# Patient Record
Sex: Male | Born: 1975 | Race: White | Hispanic: No | Smoking: Current every day smoker
Health system: Southern US, Community
[De-identification: ages and names within clinical notes are randomized; demographics above are authoritative.]

## PROBLEM LIST (undated history)

## (undated) DIAGNOSIS — Z789 Other specified health status: Secondary | ICD-10-CM

## (undated) HISTORY — PX: COLON SURGERY: SHX602

---

## 1998-08-03 ENCOUNTER — Emergency Department (HOSPITAL_COMMUNITY): Admission: EM | Admit: 1998-08-03 | Discharge: 1998-08-03 | Payer: Self-pay | Admitting: Emergency Medicine

## 1998-08-16 ENCOUNTER — Emergency Department (HOSPITAL_COMMUNITY): Admission: EM | Admit: 1998-08-16 | Discharge: 1998-08-16 | Payer: Self-pay | Admitting: Emergency Medicine

## 1998-12-06 ENCOUNTER — Encounter: Admission: RE | Admit: 1998-12-06 | Discharge: 1998-12-06 | Payer: Self-pay | Admitting: Family Medicine

## 1999-01-24 ENCOUNTER — Encounter: Admission: RE | Admit: 1999-01-24 | Discharge: 1999-01-24 | Payer: Self-pay | Admitting: Family Medicine

## 2003-06-05 ENCOUNTER — Emergency Department (HOSPITAL_COMMUNITY): Admission: AD | Admit: 2003-06-05 | Discharge: 2003-06-05 | Payer: Self-pay | Admitting: Emergency Medicine

## 2003-06-05 ENCOUNTER — Encounter: Payer: Self-pay | Admitting: Emergency Medicine

## 2003-07-02 ENCOUNTER — Emergency Department (HOSPITAL_COMMUNITY): Admission: EM | Admit: 2003-07-02 | Discharge: 2003-07-03 | Payer: Self-pay | Admitting: Emergency Medicine

## 2003-07-02 ENCOUNTER — Encounter: Payer: Self-pay | Admitting: Emergency Medicine

## 2003-07-11 ENCOUNTER — Ambulatory Visit (HOSPITAL_BASED_OUTPATIENT_CLINIC_OR_DEPARTMENT_OTHER): Admission: RE | Admit: 2003-07-11 | Discharge: 2003-07-11 | Payer: Self-pay | Admitting: Orthopedic Surgery

## 2009-04-02 ENCOUNTER — Emergency Department (HOSPITAL_COMMUNITY): Admission: EM | Admit: 2009-04-02 | Discharge: 2009-04-02 | Payer: Self-pay | Admitting: Family Medicine

## 2011-04-25 NOTE — Op Note (Signed)
NAMEWESTLEY, BLASS                        ACCOUNT NO.:  [id number]   MEDICAL RECORD NO.:  [id number]                   PATIENT TYPE:  AMB   LOCATION:  DSC                                  FACILITY:  [facility]   PHYSICIAN:  Rodney A. Chaney Malling, M.D.           DATE OF BIRTH:  1976/11/17   DATE OF PROCEDURE:  07/11/2003  DATE OF DISCHARGE:                                 OPERATIVE REPORT   PREOPERATIVE DIAGNOSIS:  Fracture of mid-third fifth metacarpal, left hand.   POSTOPERATIVE DIAGNOSIS:  Fracture of mid-third fifth metacarpal, left hand.   OPERATION:  Open reduction and internal fixation using five-hole mini-plate  and fixation screws.   SURGEON:  Lenard Galloway. Chaney Malling, M.D.   ANESTHESIA:  General.   PROCEDURE:  The patient was placed on the operating table in the supine  position with a pneumatic tourniquet about the left upper arm.  The left  upper extremity was prepped with DuraPrep and draped out in the usual  manner.  The hand was then wrapped out with an Esmarch. Tourniquet was  elevated.  An incision was made over the dorsal aspect of the fifth  metacarpal.  The skin edges were retracted.  The loupe magnification was  used throughout.  Superficial cutaneous nerves seen, identified, isolated,  and retracted out of the wound.  Extensor tendons were also moved out of the  wound.  Dissection was carried down to the fifth metacarpal.  The fracture  site could clearly be seen.  A small periosteal elevator was used to strip  off the soft tissue, and with traction, an excellent anatomic reduction was  achieved.  A five-hole plate was selected with the middle hole over the  fracture site.  Drill holes were placed through the other holes, and  appropriate length screws were used to stabilize and affix the plate to the  fracture.  Once this was completed, there was excellent stability.  There  was a good range of motion.  No rotatory malalignment of the finger  __________ palm of  the hand.  No instability.  The wound was irrigated with  saline solution.  X-rays were taken to confirm position of the plate and  length of the screws, and this all appeared quite nice.  Skin edges were  then closed with running 4-0 nylon suture, and Marcaine was used in the  wound for anesthesia.  A large bulky compressive dressing was then applied,  and the patient was returned to the recovery room in excellent condition.  Technically, this went extremely well.   DRAINS:  None.   COMPLICATIONS:  None.                                               Rodney A. Chaney Malling, M.D.    RAM/MEDQ  D:  07/11/2003  T:  07/11/2003  Job:  [id number]

## 2012-11-28 ENCOUNTER — Emergency Department: Payer: Self-pay | Admitting: Emergency Medicine

## 2013-02-09 ENCOUNTER — Encounter (HOSPITAL_COMMUNITY): Admission: EM | Disposition: A | Discharge status: Home Health | Payer: Self-pay | Source: Home / Self Care

## 2013-02-09 ENCOUNTER — Encounter (HOSPITAL_COMMUNITY): Payer: Self-pay | Admitting: Anesthesiology

## 2013-02-09 ENCOUNTER — Inpatient Hospital Stay (HOSPITAL_COMMUNITY): Payer: Self-pay | Admitting: Certified Registered Nurse Anesthetist

## 2013-02-09 ENCOUNTER — Inpatient Hospital Stay (HOSPITAL_COMMUNITY): Payer: Self-pay

## 2013-02-09 ENCOUNTER — Encounter (HOSPITAL_COMMUNITY): Payer: Self-pay | Admitting: Certified Registered Nurse Anesthetist

## 2013-02-09 ENCOUNTER — Emergency Department (HOSPITAL_COMMUNITY): Payer: Self-pay

## 2013-02-09 ENCOUNTER — Emergency Department (HOSPITAL_COMMUNITY): Payer: Self-pay | Admitting: Anesthesiology

## 2013-02-09 ENCOUNTER — Inpatient Hospital Stay (HOSPITAL_COMMUNITY)
Admission: EM | Admit: 2013-02-09 | Discharge: 2013-02-19 | DRG: 492 | Disposition: A | Discharge status: Home Health | Payer: MEDICAID | Attending: General Surgery | Admitting: General Surgery

## 2013-02-09 ENCOUNTER — Encounter (HOSPITAL_COMMUNITY): Payer: Self-pay

## 2013-02-09 DIAGNOSIS — K560 Paralytic ileus: Secondary | ICD-10-CM | POA: Diagnosis not present

## 2013-02-09 DIAGNOSIS — Y990 Civilian activity done for income or pay: Secondary | ICD-10-CM

## 2013-02-09 DIAGNOSIS — J189 Pneumonia, unspecified organism: Secondary | ICD-10-CM | POA: Diagnosis not present

## 2013-02-09 DIAGNOSIS — Y93H2 Activity, gardening and landscaping: Secondary | ICD-10-CM

## 2013-02-09 DIAGNOSIS — S82209A Unspecified fracture of shaft of unspecified tibia, initial encounter for closed fracture: Secondary | ICD-10-CM

## 2013-02-09 DIAGNOSIS — Z23 Encounter for immunization: Secondary | ICD-10-CM

## 2013-02-09 DIAGNOSIS — R188 Other ascites: Secondary | ICD-10-CM

## 2013-02-09 DIAGNOSIS — S139XXA Sprain of joints and ligaments of unspecified parts of neck, initial encounter: Secondary | ICD-10-CM | POA: Diagnosis present

## 2013-02-09 DIAGNOSIS — S3681XA Injury of peritoneum, initial encounter: Secondary | ICD-10-CM

## 2013-02-09 DIAGNOSIS — IMO0002 Reserved for concepts with insufficient information to code with codable children: Secondary | ICD-10-CM | POA: Diagnosis present

## 2013-02-09 DIAGNOSIS — Y838 Other surgical procedures as the cause of abnormal reaction of the patient, or of later complication, without mention of misadventure at the time of the procedure: Secondary | ICD-10-CM | POA: Diagnosis not present

## 2013-02-09 DIAGNOSIS — S3991XA Unspecified injury of abdomen, initial encounter: Secondary | ICD-10-CM

## 2013-02-09 DIAGNOSIS — K55059 Acute (reversible) ischemia of intestine, part and extent unspecified: Secondary | ICD-10-CM

## 2013-02-09 DIAGNOSIS — K929 Disease of digestive system, unspecified: Secondary | ICD-10-CM | POA: Diagnosis not present

## 2013-02-09 DIAGNOSIS — D62 Acute posthemorrhagic anemia: Secondary | ICD-10-CM | POA: Diagnosis not present

## 2013-02-09 DIAGNOSIS — T1490XA Injury, unspecified, initial encounter: Secondary | ICD-10-CM

## 2013-02-09 DIAGNOSIS — W230XXA Caught, crushed, jammed, or pinched between moving objects, initial encounter: Secondary | ICD-10-CM | POA: Diagnosis present

## 2013-02-09 DIAGNOSIS — E872 Acidosis, unspecified: Secondary | ICD-10-CM

## 2013-02-09 DIAGNOSIS — R578 Other shock: Secondary | ICD-10-CM

## 2013-02-09 DIAGNOSIS — S36899A Unspecified injury of other intra-abdominal organs, initial encounter: Secondary | ICD-10-CM

## 2013-02-09 DIAGNOSIS — S82871A Displaced pilon fracture of right tibia, initial encounter for closed fracture: Secondary | ICD-10-CM

## 2013-02-09 DIAGNOSIS — S82409A Unspecified fracture of shaft of unspecified fibula, initial encounter for closed fracture: Secondary | ICD-10-CM

## 2013-02-09 HISTORY — DX: Other specified health status: Z78.9

## 2013-02-09 HISTORY — PX: EXTERNAL FIXATION LEG: SHX1549

## 2013-02-09 HISTORY — PX: LAPAROTOMY: SHX154

## 2013-02-09 LAB — COMPREHENSIVE METABOLIC PANEL WITH GFR
ALT: 25 U/L (ref 0–53)
Alkaline Phosphatase: 45 U/L (ref 39–117)
BUN: 11 mg/dL (ref 6–23)
CO2: 23 meq/L (ref 19–32)
GFR calc Af Amer: >90 mL/min (ref >90)
GFR calc non Af Amer: 87 mL/min — ABNORMAL LOW (ref >90)
Glucose, Bld: 186 mg/dL — ABNORMAL HIGH (ref 70–99)
Potassium: 3 meq/L — ABNORMAL LOW (ref 3.5–5.1)
Total Protein: 7.7 g/dL (ref 6.0–8.3)

## 2013-02-09 LAB — CG4 I-STAT (LACTIC ACID): Lactic Acid, Venous: 4.36 mmol/L — ABNORMAL HIGH (ref 0.5–2.2)

## 2013-02-09 LAB — POCT I-STAT, CHEM 8
BUN: 11 mg/dL (ref 6–23)
Calcium, Ion: 1.11 mmol/L — ABNORMAL LOW (ref 1.12–1.23)
Chloride: 102 mEq/L (ref 96–112)
Creatinine, Ser: 1 mg/dL (ref 0.50–1.35)
Glucose, Bld: 182 mg/dL — ABNORMAL HIGH (ref 70–99)
HCT: 47 % (ref 39.0–52.0)
Hemoglobin: 16 g/dL (ref 13.0–17.0)
Potassium: 2.9 mEq/L — ABNORMAL LOW (ref 3.5–5.1)
Sodium: 138 mEq/L (ref 135–145)
TCO2: 24 mmol/L (ref 0–100)

## 2013-02-09 LAB — POCT I-STAT 7, (LYTES, BLD GAS, ICA,H+H)
Acid-base deficit: 1 mmol/L (ref 0.0–2.0)
Bicarbonate: 26.2 meq/L — ABNORMAL HIGH (ref 20.0–24.0)
Calcium, Ion: 1.11 mmol/L — ABNORMAL LOW (ref 1.12–1.23)
HCT: 31 % — ABNORMAL LOW (ref 39.0–52.0)
Hemoglobin: 10.5 g/dL — ABNORMAL LOW (ref 13.0–17.0)
O2 Saturation: 100 %
Patient temperature: 36.2
Potassium: 3.6 mEq/L (ref 3.5–5.1)
Sodium: 137 meq/L (ref 135–145)
TCO2: 28 mmol/L (ref 0–100)
pCO2 arterial: 51.1 mmHg — ABNORMAL HIGH (ref 35.0–45.0)
pH, Arterial: 7.313 — ABNORMAL LOW (ref 7.350–7.450)
pO2, Arterial: 477 mmHg — ABNORMAL HIGH (ref 80.0–100.0)

## 2013-02-09 LAB — COMPREHENSIVE METABOLIC PANEL
AST: 30 U/L (ref 0–37)
Albumin: 4.5 g/dL (ref 3.5–5.2)
Calcium: 10.1 mg/dL (ref 8.4–10.5)
Chloride: 96 mEq/L (ref 96–112)
Creatinine, Ser: 1.09 mg/dL (ref 0.50–1.35)
Sodium: 137 mEq/L (ref 135–145)
Total Bilirubin: 0.4 mg/dL (ref 0.3–1.2)

## 2013-02-09 LAB — ABO/RH: ABO/RH(D): A POS

## 2013-02-09 LAB — PROTIME-INR
INR: 1.09 (ref 0.00–1.49)
Prothrombin Time: 14 s (ref 11.6–15.2)

## 2013-02-09 LAB — CBC
HCT: 43 % (ref 39.0–52.0)
Hemoglobin: 15.6 g/dL (ref 13.0–17.0)
MCH: 31.4 pg (ref 26.0–34.0)
MCHC: 36.3 g/dL — ABNORMAL HIGH (ref 30.0–36.0)
MCV: 86.5 fL (ref 78.0–100.0)
Platelets: 301 10*3/uL (ref 150–400)
RBC: 4.97 MIL/uL (ref 4.22–5.81)
RDW: 12.3 % (ref 11.5–15.5)
WBC: 20.6 10*3/uL — ABNORMAL HIGH (ref 4.0–10.5)

## 2013-02-09 SURGERY — EXTERNAL FIXATION, LOWER EXTREMITY
Anesthesia: General | Site: Leg Lower | Laterality: Right | Wound class: Clean

## 2013-02-09 SURGERY — LAPAROTOMY, EXPLORATORY
Anesthesia: General | Site: Abdomen | Wound class: Clean Contaminated

## 2013-02-09 MED ORDER — ONDANSETRON HCL 4 MG/2ML IJ SOLN
INTRAMUSCULAR | Status: DC | PRN
  Administered 2013-02-09: 4 mg via INTRAVENOUS

## 2013-02-09 MED ORDER — FENTANYL CITRATE 0.05 MG/ML IJ SOLN
INTRAMUSCULAR | Status: AC
  Filled 2013-02-09: qty 2

## 2013-02-09 MED ORDER — LACTATED RINGERS IV SOLN
INTRAVENOUS | Status: DC | PRN
  Administered 2013-02-09: 18:00:00 via INTRAVENOUS

## 2013-02-09 MED ORDER — CEFAZOLIN SODIUM-DEXTROSE 2-3 GM-% IV SOLR
2.0000 g | INTRAVENOUS | Status: DC
  Filled 2013-02-09: qty 50

## 2013-02-09 MED ORDER — ROCURONIUM BROMIDE 100 MG/10ML IV SOLN
INTRAVENOUS | Status: DC | PRN
  Administered 2013-02-09: 30 mg via INTRAVENOUS
  Administered 2013-02-09: 10 mg via INTRAVENOUS
  Administered 2013-02-09: 20 mg via INTRAVENOUS

## 2013-02-09 MED ORDER — SUCCINYLCHOLINE CHLORIDE 20 MG/ML IJ SOLN
INTRAMUSCULAR | Status: DC | PRN
  Administered 2013-02-09: 100 mg via INTRAVENOUS

## 2013-02-09 MED ORDER — SODIUM CHLORIDE 0.9 % IV SOLN
INTRAVENOUS | Status: AC | PRN
  Administered 2013-02-09: 75 mL/h via INTRAVENOUS

## 2013-02-09 MED ORDER — ONDANSETRON HCL 4 MG/2ML IJ SOLN
INTRAMUSCULAR | Status: AC
  Administered 2013-02-09: 4 mg
  Filled 2013-02-09: qty 2

## 2013-02-09 MED ORDER — HYDROMORPHONE 0.3 MG/ML IV SOLN
INTRAVENOUS | Status: AC
  Administered 2013-02-09: 23:00:00
  Filled 2013-02-09: qty 25

## 2013-02-09 MED ORDER — HYDROMORPHONE 0.3 MG/ML IV SOLN
INTRAVENOUS | Status: DC
  Administered 2013-02-10: 1.9 mg via INTRAVENOUS
  Administered 2013-02-10: 3.6 mg via INTRAVENOUS
  Administered 2013-02-10: 3.9 mg via INTRAVENOUS
  Administered 2013-02-10: 1.8 mg via INTRAVENOUS
  Administered 2013-02-10: 22:00:00 via INTRAVENOUS
  Administered 2013-02-10: 3.26 mg via INTRAVENOUS
  Administered 2013-02-10: 3.3 mg via INTRAVENOUS
  Administered 2013-02-10: 05:00:00 via INTRAVENOUS
  Administered 2013-02-10: 2.1 mg via INTRAVENOUS
  Administered 2013-02-11: 3.6 mg via INTRAVENOUS
  Administered 2013-02-11: 1.5 mg via INTRAVENOUS
  Administered 2013-02-11: 3.3 mg via INTRAVENOUS
  Administered 2013-02-11: 4.72 mg via INTRAVENOUS
  Administered 2013-02-11: 3 mg via INTRAVENOUS
  Administered 2013-02-11: 06:00:00 via INTRAVENOUS
  Administered 2013-02-12: 3 mg via INTRAVENOUS
  Administered 2013-02-12: 0.3 mg via INTRAVENOUS
  Administered 2013-02-12: 2.4 mg via INTRAVENOUS
  Administered 2013-02-12 (×2): 1.8 mg via INTRAVENOUS
  Administered 2013-02-12: 02:00:00 via INTRAVENOUS
  Administered 2013-02-13: 1.2 mg via INTRAVENOUS
  Administered 2013-02-13: 1.5 mg via INTRAVENOUS
  Administered 2013-02-13: 12:00:00 via INTRAVENOUS
  Administered 2013-02-13: 1.5 mg via INTRAVENOUS
  Administered 2013-02-13 (×2): 0.9 mg via INTRAVENOUS
  Administered 2013-02-14 (×2): via INTRAVENOUS
  Administered 2013-02-14: 1.8 mg via INTRAVENOUS
  Administered 2013-02-14: 0.9 mg via INTRAVENOUS
  Administered 2013-02-14: 2.7 mg via INTRAVENOUS
  Administered 2013-02-15: 2.1 mg via INTRAVENOUS
  Administered 2013-02-15: 2.4 mg via INTRAVENOUS
  Administered 2013-02-15: 3.9 mg via INTRAVENOUS
  Administered 2013-02-15: 2.1 mg via INTRAVENOUS
  Administered 2013-02-15: 13:00:00 via INTRAVENOUS
  Administered 2013-02-15: 3.29 mg via INTRAVENOUS
  Administered 2013-02-15 (×2): 2.1 mg via INTRAVENOUS
  Administered 2013-02-16: 1.2 mg via INTRAVENOUS
  Administered 2013-02-16: 2.37 mg via INTRAVENOUS
  Administered 2013-02-16: 2.1 mg via INTRAVENOUS
  Administered 2013-02-16: 3.3 mg via INTRAVENOUS
  Administered 2013-02-16: 2.6 mg via INTRAVENOUS
  Administered 2013-02-16: 03:00:00 via INTRAVENOUS
  Administered 2013-02-17: 1.2 mg via INTRAVENOUS
  Administered 2013-02-17: 0.9 mg via INTRAVENOUS
  Administered 2013-02-17: 2.1 mg via INTRAVENOUS
  Filled 2013-02-09 (×16): qty 25

## 2013-02-09 MED ORDER — ONDANSETRON HCL 4 MG/2ML IJ SOLN
4.0000 mg | Freq: Four times a day (QID) | INTRAMUSCULAR | Status: DC | PRN
  Filled 2013-02-09: qty 2

## 2013-02-09 MED ORDER — ALBUMIN HUMAN 5 % IV SOLN
INTRAVENOUS | Status: DC | PRN
  Administered 2013-02-09: 22:00:00 via INTRAVENOUS

## 2013-02-09 MED ORDER — LIDOCAINE HCL (CARDIAC) 20 MG/ML IV SOLN
INTRAVENOUS | Status: DC | PRN
  Administered 2013-02-09: 100 mg via INTRAVENOUS

## 2013-02-09 MED ORDER — ONDANSETRON HCL 4 MG/2ML IJ SOLN
4.0000 mg | Freq: Once | INTRAMUSCULAR | Status: DC | PRN
  Filled 2013-02-09: qty 2

## 2013-02-09 MED ORDER — FENTANYL CITRATE 0.05 MG/ML IJ SOLN
INTRAMUSCULAR | Status: AC
  Administered 2013-02-09: 50 ug
  Filled 2013-02-09: qty 2

## 2013-02-09 MED ORDER — FENTANYL CITRATE 0.05 MG/ML IJ SOLN
INTRAMUSCULAR | Status: DC | PRN
  Administered 2013-02-09 (×2): 25 ug via INTRAVENOUS
  Administered 2013-02-09: 50 ug via INTRAVENOUS
  Administered 2013-02-09: 25 ug via INTRAVENOUS
  Administered 2013-02-09: 100 ug via INTRAVENOUS
  Administered 2013-02-09: 25 ug via INTRAVENOUS

## 2013-02-09 MED ORDER — NALOXONE HCL 0.4 MG/ML IJ SOLN
0.4000 mg | INTRAMUSCULAR | Status: DC | PRN
  Filled 2013-02-09: qty 1

## 2013-02-09 MED ORDER — PHENYLEPHRINE HCL 10 MG/ML IJ SOLN
INTRAMUSCULAR | Status: DC | PRN
  Administered 2013-02-09 (×3): 80 ug via INTRAVENOUS

## 2013-02-09 MED ORDER — HYDROMORPHONE HCL PF 1 MG/ML IJ SOLN
INTRAMUSCULAR | Status: AC
  Administered 2013-02-09: 0.5 mg via INTRAVENOUS
  Filled 2013-02-09: qty 1

## 2013-02-09 MED ORDER — OXYCODONE HCL 5 MG PO TABS: 5.0000 mg | ORAL_TABLET | Freq: Once | ORAL | Status: DC | PRN

## 2013-02-09 MED ORDER — PROPOFOL 10 MG/ML IV BOLUS
INTRAVENOUS | Status: DC | PRN
  Administered 2013-02-09: 200 mg via INTRAVENOUS

## 2013-02-09 MED ORDER — NEOSTIGMINE METHYLSULFATE 1 MG/ML IJ SOLN
INTRAMUSCULAR | Status: DC | PRN
  Administered 2013-02-09: 5 mg via INTRAVENOUS

## 2013-02-09 MED ORDER — HYDROMORPHONE HCL PF 1 MG/ML IJ SOLN
0.2500 mg | INTRAMUSCULAR | Status: DC | PRN
  Administered 2013-02-09 (×2): 0.5 mg via INTRAVENOUS

## 2013-02-09 MED ORDER — ONDANSETRON HCL 4 MG/2ML IJ SOLN
INTRAMUSCULAR | Status: AC
  Filled 2013-02-09: qty 2

## 2013-02-09 MED ORDER — FENTANYL CITRATE 0.05 MG/ML IJ SOLN
INTRAMUSCULAR | Status: AC | PRN
  Administered 2013-02-09: 100 ug via INTRAVENOUS
  Administered 2013-02-09 (×3): 50 ug via INTRAVENOUS

## 2013-02-09 MED ORDER — ETOMIDATE 2 MG/ML IV SOLN
INTRAVENOUS | Status: DC | PRN
  Administered 2013-02-09: 16 mg via INTRAVENOUS

## 2013-02-09 MED ORDER — GLYCOPYRROLATE 0.2 MG/ML IJ SOLN
INTRAMUSCULAR | Status: DC | PRN
  Administered 2013-02-09: 0.6 mg via INTRAVENOUS

## 2013-02-09 MED ORDER — FENTANYL CITRATE 0.05 MG/ML IJ SOLN
INTRAMUSCULAR | Status: AC
  Administered 2013-02-09: 100 ug
  Filled 2013-02-09: qty 2

## 2013-02-09 MED ORDER — CEFAZOLIN SODIUM 1-5 GM-% IV SOLN
INTRAVENOUS | Status: AC
  Filled 2013-02-09: qty 50

## 2013-02-09 MED ORDER — CEFAZOLIN SODIUM-DEXTROSE 2-3 GM-% IV SOLR
INTRAVENOUS | Status: DC | PRN
  Administered 2013-02-09: 2 g via INTRAVENOUS

## 2013-02-09 MED ORDER — 0.9 % SODIUM CHLORIDE (POUR BTL) OPTIME
TOPICAL | Status: DC | PRN
  Administered 2013-02-09 (×6): 1000 mL

## 2013-02-09 MED ORDER — IOHEXOL 300 MG/ML  SOLN
100.0000 mL | Freq: Once | INTRAMUSCULAR | Status: AC | PRN
  Administered 2013-02-09: 100 mL via INTRAVENOUS

## 2013-02-09 MED ORDER — MIDAZOLAM HCL 5 MG/5ML IJ SOLN
INTRAMUSCULAR | Status: DC | PRN
  Administered 2013-02-09: 2 mg via INTRAVENOUS

## 2013-02-09 MED ORDER — KCL IN DEXTROSE-NACL 20-5-0.45 MEQ/L-%-% IV SOLN
INTRAVENOUS | Status: DC
  Administered 2013-02-10: 19:00:00 via INTRAVENOUS
  Administered 2013-02-10: 125 mL via INTRAVENOUS
  Administered 2013-02-10 – 2013-02-16 (×14): via INTRAVENOUS
  Filled 2013-02-09 (×25): qty 1000

## 2013-02-09 MED ORDER — DIPHENHYDRAMINE HCL 50 MG/ML IJ SOLN
12.5000 mg | Freq: Four times a day (QID) | INTRAMUSCULAR | Status: DC | PRN
  Filled 2013-02-09: qty 0.25

## 2013-02-09 MED ORDER — OXYCODONE HCL 5 MG/5ML PO SOLN: 5.0000 mg | Freq: Once | ORAL | Status: DC | PRN

## 2013-02-09 MED ORDER — FENTANYL CITRATE 0.05 MG/ML IJ SOLN
INTRAMUSCULAR | Status: DC | PRN
  Administered 2013-02-09 (×2): 50 ug via INTRAVENOUS
  Administered 2013-02-09: 100 ug via INTRAVENOUS
  Administered 2013-02-09: 50 ug via INTRAVENOUS
  Administered 2013-02-09: 100 ug via INTRAVENOUS
  Administered 2013-02-09: 50 ug via INTRAVENOUS
  Administered 2013-02-09: 100 ug via INTRAVENOUS
  Administered 2013-02-09: 250 ug via INTRAVENOUS

## 2013-02-09 MED ORDER — LACTATED RINGERS IV SOLN
INTRAVENOUS | Status: DC | PRN
  Administered 2013-02-09 (×3): via INTRAVENOUS

## 2013-02-09 MED ORDER — SODIUM CHLORIDE 0.9 % IJ SOLN: 9.0000 mL | INTRAMUSCULAR | Status: DC | PRN

## 2013-02-09 MED ORDER — CEFAZOLIN SODIUM 1-5 GM-% IV SOLN
1.0000 g | Freq: Four times a day (QID) | INTRAVENOUS | Status: AC
  Administered 2013-02-10 (×3): 1 g via INTRAVENOUS
  Filled 2013-02-09 (×3): qty 50

## 2013-02-09 MED ORDER — HYDROMORPHONE 0.3 MG/ML IV SOLN
INTRAVENOUS | Status: AC
  Filled 2013-02-09: qty 25

## 2013-02-09 MED ORDER — CEFAZOLIN SODIUM 1-5 GM-% IV SOLN
1.0000 g | INTRAVENOUS | Status: AC
  Administered 2013-02-09: 1 g via INTRAVENOUS

## 2013-02-09 MED ORDER — LACTATED RINGERS IV SOLN
INTRAVENOUS | Status: AC | PRN
  Administered 2013-02-09: 75 mL/h via INTRAVENOUS

## 2013-02-09 MED ORDER — DIPHENHYDRAMINE HCL 12.5 MG/5ML PO ELIX
12.5000 mg | ORAL_SOLUTION | Freq: Four times a day (QID) | ORAL | Status: DC | PRN
  Filled 2013-02-09: qty 5

## 2013-02-09 MED ORDER — SODIUM CHLORIDE 0.9 % IR SOLN
Status: DC | PRN
  Administered 2013-02-09: 1000 mL

## 2013-02-09 MED ORDER — MIDAZOLAM HCL 5 MG/5ML IJ SOLN
INTRAMUSCULAR | Status: DC | PRN
  Administered 2013-02-09: 1 mg via INTRAVENOUS

## 2013-02-09 SURGICAL SUPPLY — 41 items
BLADE SURG ROTATE 9660 (MISCELLANEOUS) IMPLANT
CANISTER SUCTION 2500CC (MISCELLANEOUS) ×2 IMPLANT
CHLORAPREP W/TINT 26ML (MISCELLANEOUS) ×2 IMPLANT
CLOTH BEACON ORANGE TIMEOUT ST (SAFETY) ×2 IMPLANT
COVER MAYO STAND STRL (DRAPES) IMPLANT
COVER SURGICAL LIGHT HANDLE (MISCELLANEOUS) ×2 IMPLANT
DRAPE LAPAROSCOPIC ABDOMINAL (DRAPES) ×2 IMPLANT
DRAPE UTILITY 15X26 W/TAPE STR (DRAPE) ×4 IMPLANT
DRAPE WARM FLUID 44X44 (DRAPE) ×2 IMPLANT
ELECT BLADE 6.5 EXT (BLADE) ×1 IMPLANT
ELECT CAUTERY BLADE 6.4 (BLADE) ×2 IMPLANT
ELECT REM PT RETURN 9FT ADLT (ELECTROSURGICAL) ×2
ELECTRODE REM PT RTRN 9FT ADLT (ELECTROSURGICAL) ×1 IMPLANT
GLOVE BIOGEL PI IND STRL 8 (GLOVE) ×1 IMPLANT
GLOVE BIOGEL PI INDICATOR 8 (GLOVE) ×1
GLOVE ECLIPSE 7.5 STRL STRAW (GLOVE) ×2 IMPLANT
GOWN STRL NON-REIN LRG LVL3 (GOWN DISPOSABLE) ×6 IMPLANT
KIT BASIN OR (CUSTOM PROCEDURE TRAY) ×2 IMPLANT
KIT ROOM TURNOVER OR (KITS) ×2 IMPLANT
LIGASURE IMPACT 36 18CM CVD LR (INSTRUMENTS) IMPLANT
NS IRRIG 1000ML POUR BTL (IV SOLUTION) ×4 IMPLANT
PACK GENERAL/GYN (CUSTOM PROCEDURE TRAY) ×2 IMPLANT
PAD ARMBOARD 7.5X6 YLW CONV (MISCELLANEOUS) ×2 IMPLANT
PAD SHARPS MAGNETIC DISPOSAL (MISCELLANEOUS) IMPLANT
SPECIMEN JAR X LARGE (MISCELLANEOUS) IMPLANT
SPONGE GAUZE 4X4 12PLY (GAUZE/BANDAGES/DRESSINGS) ×2 IMPLANT
SPONGE LAP 18X18 X RAY DECT (DISPOSABLE) ×5 IMPLANT
STAPLER GUN LINEAR PROX 60 (STAPLE) ×1 IMPLANT
STAPLER PROXIMATE 75MM BLUE (STAPLE) ×1 IMPLANT
STAPLER VISISTAT 35W (STAPLE) ×2 IMPLANT
SUCTION POOLE TIP (SUCTIONS) ×2 IMPLANT
SUT PDS AB 1 TP1 96 (SUTURE) ×4 IMPLANT
SUT SILK 2 0 SH CR/8 (SUTURE) ×4 IMPLANT
SUT SILK 2 0 TIES 10X30 (SUTURE) ×2 IMPLANT
SUT SILK 3 0 SH CR/8 (SUTURE) ×3 IMPLANT
SUT SILK 3 0 TIES 10X30 (SUTURE) ×2 IMPLANT
SYR BULB IRRIGATION 50ML (SYRINGE) IMPLANT
TOWEL OR 17X26 10 PK STRL BLUE (TOWEL DISPOSABLE) ×2 IMPLANT
TRAY FOLEY CATH 14FRSI W/METER (CATHETERS) IMPLANT
WATER STERILE IRR 1000ML POUR (IV SOLUTION) IMPLANT
YANKAUER SUCT BULB TIP NO VENT (SUCTIONS) ×1 IMPLANT

## 2013-02-09 SURGICAL SUPPLY — 60 items
BANDAGE ELASTIC 4 VELCRO ST LF (GAUZE/BANDAGES/DRESSINGS) ×1 IMPLANT
BANDAGE ELASTIC 6 VELCRO ST LF (GAUZE/BANDAGES/DRESSINGS) ×1 IMPLANT
BANDAGE ESMARK 6X9 LF (GAUZE/BANDAGES/DRESSINGS) ×1 IMPLANT
BANDAGE GAUZE ELAST BULKY 4 IN (GAUZE/BANDAGES/DRESSINGS) ×2 IMPLANT
BNDG CMPR 9X6 STRL LF SNTH (GAUZE/BANDAGES/DRESSINGS)
BNDG COHESIVE 6X5 TAN STRL LF (GAUZE/BANDAGES/DRESSINGS) ×2 IMPLANT
BNDG ESMARK 6X9 LF (GAUZE/BANDAGES/DRESSINGS)
CLAMP ADJUSTABLE (Clamp) ×2 IMPLANT
CLAMP ROD ATTACHMENT (Clamp) ×1 IMPLANT
CLEANER TIP ELECTROSURG 2X2 (MISCELLANEOUS) ×1 IMPLANT
CLOTH BEACON ORANGE TIMEOUT ST (SAFETY) ×2 IMPLANT
COVER SURGICAL LIGHT HANDLE (MISCELLANEOUS) ×2 IMPLANT
CUFF TOURNIQUET SINGLE 18IN (TOURNIQUET CUFF) IMPLANT
CUFF TOURNIQUET SINGLE 24IN (TOURNIQUET CUFF) IMPLANT
CUFF TOURNIQUET SINGLE 34IN LL (TOURNIQUET CUFF) IMPLANT
DRAPE C-ARM 42X72 X-RAY (DRAPES) IMPLANT
DRAPE U-SHAPE 47X51 STRL (DRAPES) ×2 IMPLANT
DRSG ADAPTIC 3X8 NADH LF (GAUZE/BANDAGES/DRESSINGS) ×1 IMPLANT
ELECT REM PT RETURN 9FT ADLT (ELECTROSURGICAL)
ELECTRODE REM PT RTRN 9FT ADLT (ELECTROSURGICAL) ×1 IMPLANT
EVACUATOR 1/8 PVC DRAIN (DRAIN) IMPLANT
GAUZE XEROFORM 5X9 LF (GAUZE/BANDAGES/DRESSINGS) ×1 IMPLANT
GLOVE BIOGEL PI ORTHO PRO SZ8 (GLOVE) ×1
GLOVE ORTHO TXT STRL SZ7.5 (GLOVE) ×2 IMPLANT
GLOVE PI ORTHO PRO STRL SZ8 (GLOVE) ×1 IMPLANT
GLOVE SURG ORTHO 8.0 STRL STRW (GLOVE) ×4 IMPLANT
GOWN STRL NON-REIN LRG LVL3 (GOWN DISPOSABLE) ×1 IMPLANT
HANDPIECE INTERPULSE COAX TIP (DISPOSABLE)
KIT BASIN OR (CUSTOM PROCEDURE TRAY) ×2 IMPLANT
KIT ROOM TURNOVER OR (KITS) ×2 IMPLANT
LARGE MULTI-PIN CLAMP, 4 POSITION ×1 IMPLANT
MANIFOLD NEPTUNE II (INSTRUMENTS) ×1 IMPLANT
NEEDLE 22X1 1/2 (OR ONLY) (NEEDLE) IMPLANT
NS IRRIG 1000ML POUR BTL (IV SOLUTION) ×2 IMPLANT
PACK ORTHO EXTREMITY (CUSTOM PROCEDURE TRAY) ×2 IMPLANT
PAD ARMBOARD 7.5X6 YLW CONV (MISCELLANEOUS) ×4 IMPLANT
PADDING CAST COTTON 6X4 STRL (CAST SUPPLIES) ×4 IMPLANT
ROD CARBON FIBER 450MM (Rod) ×1 IMPLANT
ROD CRBN FBR LRG EX-FX 11X400 (Rod) ×1 IMPLANT
SCREW SHANZ 5.0X125 (Screw) ×2 IMPLANT
SET HNDPC FAN SPRY TIP SCT (DISPOSABLE) IMPLANT
SPONGE GAUZE 4X4 12PLY (GAUZE/BANDAGES/DRESSINGS) ×1 IMPLANT
SPONGE LAP 18X18 X RAY DECT (DISPOSABLE) ×2 IMPLANT
SPONGE SCRUB IODOPHOR (GAUZE/BANDAGES/DRESSINGS) ×2 IMPLANT
STAPLER VISISTAT 35W (STAPLE) IMPLANT
STEINMANN PIN WITH CENTRAL THREAD, 5.0MM X 300MM ×1 IMPLANT
STOCKINETTE IMPERVIOUS LG (DRAPES) ×2 IMPLANT
SUCTION FRAZIER TIP 10 FR DISP (SUCTIONS) IMPLANT
SUT ETHILON 3 0 PS 1 (SUTURE) IMPLANT
SUT VIC AB 0 CT1 27 (SUTURE)
SUT VIC AB 0 CT1 27XBRD ANBCTR (SUTURE) ×2 IMPLANT
SUT VIC AB 2-0 CT1 27 (SUTURE)
SUT VIC AB 2-0 CT1 TAPERPNT 27 (SUTURE) ×2 IMPLANT
SYR CONTROL 10ML LL (SYRINGE) IMPLANT
TOWEL OR 17X24 6PK STRL BLUE (TOWEL DISPOSABLE) ×4 IMPLANT
TOWEL OR 17X26 10 PK STRL BLUE (TOWEL DISPOSABLE) ×4 IMPLANT
TUBE CONNECTING 12X1/4 (SUCTIONS) ×1 IMPLANT
UNDERPAD 30X30 INCONTINENT (UNDERPADS AND DIAPERS) ×2 IMPLANT
WATER STERILE IRR 1000ML POUR (IV SOLUTION) ×2 IMPLANT
YANKAUER SUCT BULB TIP NO VENT (SUCTIONS) ×1 IMPLANT

## 2013-02-09 NOTE — ED Notes (Addendum)
BP dropped while taking pt to OR, 1000 ml bolus started. Dr. Lindie Spruce paged. Pt taken straight to OR with Emergency release blood.  Dr. Lindie Spruce notified

## 2013-02-09 NOTE — Progress Notes (Signed)
Chaplain responded to ED trauma page for pt hit by falling tree. 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 I was preparing tocal pt's wife when she arrived. I stayed with her as she made calls and as she received update from doctor. Stayed with pt's wife until pt was taken for scan.

## 2013-02-09 NOTE — Transfer of Care (Signed)
Immediate Anesthesia Transfer of Care Note  Patient: [name]  Procedure(s) Performed: Procedure(s): EXPLORATORY LAPAROTOMY with small bowel resection and repair of mesentery (N/A)  Patient Location: PACU  Anesthesia Type:General  Level of Consciousness: awake, alert  and oriented  Airway & Oxygen Therapy: Patient Spontanous Breathing and Patient connected to nasal cannula oxygen  Post-op Assessment: Report given to PACU RN and Post -op Vital signs reviewed and stable  Post vital signs: Reviewed and stable  Complications: No apparent anesthesia complications

## 2013-02-09 NOTE — Transfer of Care (Signed)
Immediate Anesthesia Transfer of Care Note  Patient: [name]  Procedure(s) Performed: Procedure(s): EXTERNAL FIXATION LEG (Right)  Patient Location: PACU  Anesthesia Type:General  Level of Consciousness: awake and alert   Airway & Oxygen Therapy: Patient Spontanous Breathing and Patient connected to nasal cannula oxygen  Post-op Assessment: Report given to PACU RN and Post -op Vital signs reviewed and stable  Post vital signs: Reviewed and stable  Complications: No apparent anesthesia complications

## 2013-02-09 NOTE — ED Notes (Signed)
Family at beside. Family given emotional support. 

## 2013-02-09 NOTE — Preoperative (Signed)
Beta Blockers   Reason not to administer Beta Blockers:Not Applicable. No home beta blockers 

## 2013-02-09 NOTE — Anesthesia Postprocedure Evaluation (Signed)
  Anesthesia Post-op Note  Patient: [name]  Procedure(s) Performed: Procedure(s): EXPLORATORY LAPAROTOMY with small bowel resection and repair of mesentery (N/A)  Patient Location: PACU  Anesthesia Type:General  Level of Consciousness: awake, alert  and oriented  Airway and Oxygen Therapy: Patient Spontanous Breathing  Post-op Pain: none  Post-op Assessment: Post-op Vital signs reviewed  Post-op Vital Signs: Reviewed  Complications: No apparent anesthesia complications

## 2013-02-09 NOTE — Anesthesia Preprocedure Evaluation (Signed)
Anesthesia Evaluation  Patient identified by MRN, date of birth, ID band Patient awake    Reviewed: Allergy & Precautions, H&P , NPO status , Patient's Chart, lab work & pertinent test results  Airway Mallampati: I TM Distance: >3 FB Neck ROM: Full    Dental  (+) Teeth Intact   Pulmonary  breath sounds clear to auscultation        Cardiovascular Rhythm:Regular Rate:Normal     Neuro/Psych    GI/Hepatic   Endo/Other    Renal/GU      Musculoskeletal   Abdominal   Peds  Hematology   Anesthesia Other Findings   Reproductive/Obstetrics                           Anesthesia Physical Anesthesia Plan  ASA: II and emergent  Anesthesia Plan: General   Post-op Pain Management:    Induction: Intravenous  Airway Management Planned: Oral ETT  Additional Equipment:   Intra-op Plan:   Post-operative Plan: Extubation in OR  Informed Consent: I have reviewed the patients History and Physical, chart, labs and discussed the procedure including the risks, benefits and alternatives for the proposed anesthesia with the patient or authorized representative who has indicated his/her understanding and acceptance.   Dental advisory given  Plan Discussed with: CRNA, Anesthesiologist and Surgeon  Anesthesia Plan Comments:         Anesthesia Quick Evaluation

## 2013-02-09 NOTE — Anesthesia Preprocedure Evaluation (Addendum)
Anesthesia Evaluation  Patient identified by MRN, date of birth, ID band Patient awake    Reviewed: Allergy & Precautions, H&P , NPO status , Patient's Chart, lab work & pertinent test results, reviewed documented beta blocker date and time   Airway Mallampati: II TM Distance: >3 FB     Dental  (+) Teeth Intact and Dental Advisory Given   Pulmonary  breath sounds clear to auscultation        Cardiovascular Rhythm:Regular Rate:Normal     Neuro/Psych    GI/Hepatic   Endo/Other    Renal/GU      Musculoskeletal   Abdominal   Peds  Hematology   Anesthesia Other Findings Pt in PACU post laparotomy awake.  He understands we are returning to the OR for placement of an Ex Fix on his R ankle.  Reproductive/Obstetrics                          Anesthesia Physical Anesthesia Plan  ASA: II  Anesthesia Plan: General   Post-op Pain Management:    Induction: Intravenous, Cricoid pressure planned and Rapid sequence  Airway Management Planned: Video Laryngoscope Planned and Oral ETT  Additional Equipment:   Intra-op Plan:   Post-operative Plan: Extubation in OR  Informed Consent: I have reviewed the patients History and Physical, chart, labs and discussed the procedure including the risks, benefits and alternatives for the proposed anesthesia with the patient or authorized representative who has indicated his/her understanding and acceptance.   Dental advisory given  Plan Discussed with: CRNA, Anesthesiologist and Surgeon  Anesthesia Plan Comments:         Anesthesia Quick Evaluation

## 2013-02-09 NOTE — ED Provider Notes (Signed)
History     CSN: [id number]  Arrival date & time 02/09/13  1622   First MD Initiated Contact with Patient 02/09/13 1628      Chief Complaint  Patient presents with  . Fall    HPI [name] is a 37 y.o. male who presents to the ED after being pinned between two large tree branches while cutting wood.  No loss of consciousness. Complains of significant pain in the abdomen and right ankle.  EMS called given significant pain.    History reviewed. No pertinent past medical history.  Past Surgical History  Procedure Laterality Date  . Colon surgery      No family history on file.  History  Substance Use Topics  . Smoking status: Not on file  . Smokeless tobacco: Not on file  . Alcohol Use: Not on file      Review of Systems  Constitutional: Negative for fever and chills.  HENT: Negative for congestion, sore throat and neck pain.   Respiratory: Negative for cough.   Gastrointestinal: Positive for abdominal pain. Negative for nausea, vomiting, diarrhea and constipation.  Endocrine: Negative for polyuria.  Genitourinary: Negative for dysuria and hematuria.  Skin: Negative for rash.  Neurological: Negative for headaches.  Psychiatric/Behavioral: Negative.   All other systems reviewed and are negative.    Allergies  Review of patient's allergies indicates no known allergies.  Home Medications  No current outpatient prescriptions on file.  BP 140/88  Pulse 107  Temp(Src) 98.2 F (36.8 C) (Oral)  Resp 10  SpO2 97%  Physical Exam  Nursing note and vitals reviewed. Constitutional: He is oriented to person, place, and time. He appears well-developed and well-nourished. No distress.  HENT:  Head: Normocephalic and atraumatic.  Right Ear: External ear normal.  Left Ear: External ear normal.  Mouth/Throat: Oropharynx is clear and moist. No oropharyngeal exudate.  Eyes: Conjunctivae are normal. Pupils are equal, round, and reactive to light. Right eye exhibits  no discharge.  Neck: Normal range of motion. Neck supple. No tracheal deviation present.  Cardiovascular: Normal rate, regular rhythm and intact distal pulses.   Pulmonary/Chest: Effort normal. No respiratory distress. He has no wheezes. He has no rales.  Abdominal: Soft. He exhibits no distension. There is tenderness in the left upper quadrant. There is rebound and guarding. There is no rigidity.  Abrasions over L chest and abdomen.  Musculoskeletal: Normal range of motion.  Obvious deformity of R ankle.  No open fracture.  2+ DP pulse.  SILT diffusely over foot.  Able to move all toes.    No other extremity trauma.  Neurological: He is alert and oriented to person, place, and time.  Skin: Skin is warm and dry. No rash noted. He is not diaphoretic.  Psychiatric: He has a normal mood and affect.    ED Course  Procedures (including critical care time)  Labs Reviewed  COMPREHENSIVE METABOLIC PANEL - Abnormal; Notable for the following:    Potassium 3.0 (*)    Glucose, Bld 186 (*)    GFR calc non Af Amer 87 (*)    All other components within normal limits  CBC - Abnormal; Notable for the following:    WBC 20.6 (*)    MCHC 36.3 (*)    All other components within normal limits  POCT I-STAT, CHEM 8 - Abnormal; Notable for the following:    Potassium 2.9 (*)    Glucose, Bld 182 (*)    Calcium, Ion 1.11 (*)  All other components within normal limits  CG4 I-STAT (LACTIC ACID) - Abnormal; Notable for the following:    Lactic Acid, Venous 4.36 (*)    All other components within normal limits  POCT I-STAT 7, (LYTES, BLD GAS, ICA,H+H) - Abnormal; Notable for the following:    pH, Arterial 7.313 (*)    pCO2 arterial 51.1 (*)    pO2, Arterial 477.0 (*)    Bicarbonate 26.2 (*)    Calcium, Ion 1.11 (*)    HCT 31.0 (*)    Hemoglobin 10.5 (*)    All other components within normal limits  PROTIME-INR  URINALYSIS, MICROSCOPIC ONLY  TYPE AND SCREEN  ABO/RH   Dg Ankle Complete  Right  02/09/2013  *RADIOLOGY REPORT*  Clinical Data: External fixation of right ankle fracture.  RIGHT ANKLE - COMPLETE 3+ VIEW  Comparison: Right ankle radiographs performed earlier today at 04:58 p.m.  Findings: Six fluoroscopic C-arm images are provided from the OR. These demonstrate successful external fixation of the right lower extremity, with screws along the proximal to mid tibia, and at the calcaneus.  The significantly comminuted fracture at the distal tibia demonstrates improved alignment, though there is still significant dorsal displacement of fragments.  IMPRESSION: Successful external fixation of right lower extremity; improved alignment of significantly comminuted fracture of the distal tibia, though there is still dorsal displacement of fragments.   Original Report Authenticated By: Tonia Ghent, M.D.    Ct Head Wo Contrast  02/09/2013  *RADIOLOGY REPORT*  Clinical Data:  Crush injury.  Pain.  CT HEAD WITHOUT CONTRAST CT CERVICAL SPINE WITHOUT CONTRAST  Technique:  Multidetector CT imaging of the head and cervical spine was performed following the standard protocol without intravenous contrast.  Multiplanar CT image reconstructions of the cervical spine were also generated.  Comparison:   None  CT HEAD  Findings: The brain appears normal without infarct, hemorrhage, mass lesion, mass effect, midline shift or abnormal extra-axial fluid collection.  No hydrocephalus or pneumocephalus.  The calvarium is intact.  Imaged paranasal sinuses and mastoid air cells are clear.  IMPRESSION: Negative exam.  CT CERVICAL SPINE  Findings: There is no fracture or subluxation of the cervical spine.  No epidural hematoma is identified.  Paraspinous soft tissue structures are unremarkable.  Lung apices are clear.  IMPRESSION: Negative exam.   Original Report Authenticated By: Holley Dexter, M.D.    Ct Chest W Contrast  02/09/2013  *RADIOLOGY REPORT*  Clinical Data:  Crush injury while trimming trees.  CT  CHEST, ABDOMEN AND PELVIS WITH CONTRAST  Technique:  Multidetector CT imaging of the chest, abdomen and pelvis was performed following the standard protocol during bolus administration of intravenous contrast.  Contrast: OMNIPAQUE IOHEXOL 300 MG/ML  SOLN the  Comparison:   None.  CT CHEST  Findings:  No pneumothorax.  No mediastinal hematoma.  No pleural or pericardial effusion.  Mild dependent atelectasis posteriorly in both lower lobes.  Lungs otherwise clear.  Normal vascular enhancement.  Thoracic spine and sternum intact.  IMPRESSION:  1.  No acute thoracic process  CT ABDOMEN AND PELVIS  Findings:  There is high attenuation   perihepatic fluid as well as fluid in the pericolic gutters left greater than right and in the cul-de-sac.  There is active extravasation in the anterior peritoneal cavity probably from proximal SMA branch on image 88/2. Delayed scans show progressive contrast extravasation.  The stomach is physiologically distended.  Small bowel is nondilated.  There is no definite bowel wall thickening,  assessment limited without any oral contrast.  The colon is decompressed. Urinary bladder incompletely distended.  There is no free air identified.  No adenopathy.  Lumbar spine intact.  Bony pelvis intact.  Unremarkable liver, spleen, kidneys, adrenal glands, pancreas. Portal vein is patent.  IMPRESSION:  1.  Active extravasation from a branch of the SMA in the right mid abdomen, with hemorrhagic ascites. I discussed the critical test results over the telephone with Dr. Lindie Spruce at the time of interpretation.   Original Report Authenticated By: D. Andria Rhein, MD    Ct Cervical Spine Wo Contrast  02/09/2013  *RADIOLOGY REPORT*  Clinical Data:  Crush injury.  Pain.  CT HEAD WITHOUT CONTRAST CT CERVICAL SPINE WITHOUT CONTRAST  Technique:  Multidetector CT imaging of the head and cervical spine was performed following the standard protocol without intravenous contrast.  Multiplanar CT image  reconstructions of the cervical spine were also generated.  Comparison:   None  CT HEAD  Findings: The brain appears normal without infarct, hemorrhage, mass lesion, mass effect, midline shift or abnormal extra-axial fluid collection.  No hydrocephalus or pneumocephalus.  The calvarium is intact.  Imaged paranasal sinuses and mastoid air cells are clear.  IMPRESSION: Negative exam.  CT CERVICAL SPINE  Findings: There is no fracture or subluxation of the cervical spine.  No epidural hematoma is identified.  Paraspinous soft tissue structures are unremarkable.  Lung apices are clear.  IMPRESSION: Negative exam.   Original Report Authenticated By: Holley Dexter, M.D.    Ct Abdomen Pelvis W Contrast  02/09/2013  *RADIOLOGY REPORT*  Clinical Data:  Crush injury while trimming trees.  CT CHEST, ABDOMEN AND PELVIS WITH CONTRAST  Technique:  Multidetector CT imaging of the chest, abdomen and pelvis was performed following the standard protocol during bolus administration of intravenous contrast.  Contrast: OMNIPAQUE IOHEXOL 300 MG/ML  SOLN the  Comparison:   None.  CT CHEST  Findings:  No pneumothorax.  No mediastinal hematoma.  No pleural or pericardial effusion.  Mild dependent atelectasis posteriorly in both lower lobes.  Lungs otherwise clear.  Normal vascular enhancement.  Thoracic spine and sternum intact.  IMPRESSION:  1.  No acute thoracic process  CT ABDOMEN AND PELVIS  Findings:  There is high attenuation   perihepatic fluid as well as fluid in the pericolic gutters left greater than right and in the cul-de-sac.  There is active extravasation in the anterior peritoneal cavity probably from proximal SMA branch on image 88/2. Delayed scans show progressive contrast extravasation.  The stomach is physiologically distended.  Small bowel is nondilated.  There is no definite bowel wall thickening, assessment limited without any oral contrast.  The colon is decompressed. Urinary bladder incompletely distended.   There is no free air identified.  No adenopathy.  Lumbar spine intact.  Bony pelvis intact.  Unremarkable liver, spleen, kidneys, adrenal glands, pancreas. Portal vein is patent.  IMPRESSION:  1.  Active extravasation from a branch of the SMA in the right mid abdomen, with hemorrhagic ascites. I discussed the critical test results over the telephone with Dr. Lindie Spruce at the time of interpretation.   Original Report Authenticated By: D. Andria Rhein, MD    Dg Pelvis Portable  02/09/2013  *RADIOLOGY REPORT*  Clinical Data: Blunt trauma, level I trauma  PORTABLE PELVIS  Comparison: None.  Findings: Hips are located.  No evidence of pelvic fracture or sacral fracture.  IMPRESSION: No evidence of pelvic fracture.   Original Report Authenticated By: Genevive Bi, M.D.  Dg Chest Portable 1 View  02/09/2013  *RADIOLOGY REPORT*  Clinical Data: :  Trauma, blunt trauma  PORTABLE CHEST - 1 VIEW  Comparison: None.  Findings: The cardiac silhouette is normal.  No evidence of pulmonary contusion or pleural fluid.  No pneumothorax.  No evidence of rib fracture or clavicle fracture.  IMPRESSION: No radiographic evidence of thoracic trauma.   Original Report Authenticated By: Genevive Bi, M.D.    Dg Ankle Right Port  02/09/2013  *RADIOLOGY REPORT*  Clinical Data: Level I trauma.  Ankle deformity  PORTABLE RIGHT ANKLE - 2 VIEW  Comparison: None.  Findings: There are two portable views of the right ankle provided. There is a severely comminuted fracture of the distal tibial metaphysis which extends to the articular surface.  There is a fracture of the distal fibula.  On the AP view there appears to be malalignment of the calcaneus with the talus.  Suspicion for dislocation.  IMPRESSION:  Severely comminuted intra-articular fracture of the distal tibia.  Fracture of the distal fibula.  Concern for dislocation.  Limited views.   Original Report Authenticated By: Genevive Bi, M.D.      1. Trauma   2. Lactic acidosis    3. Hemorrhagic shock   4. Intra-abdominal fluid       MDM   37 year old male presents to the emergency department after being pinned between 2-3 branches. On initial arrival patient was severe left upper quadrant tenderness and obvious ankle deformity. Stable vital signs. FAST exam performed given mechanism and degree tenderness.  FAST positive for large amount of free fluid in all views. Upgraded to a level I trauma. Surgeon at bedside within 10-15 minutes of patient arrival in the emergency department. Standard trauma survey completed.  Patient transferred to scanner and then to operating room for repair.      Arloa Koh, MD 02/09/13 [phone]

## 2013-02-09 NOTE — ED Notes (Signed)
To ED via POV for eval after trimming trees and had limb 'crush' pt when swinging. Pt alert. With abrasions to abd, face, and limbs. rle with deformity

## 2013-02-09 NOTE — Progress Notes (Signed)
Orthopedic Tech Progress Note Patient Details:  [name] 08-Jan-1976 [id number]  Ortho Devices Type of Ortho Device: Stirrup splint;Ace wrap Ortho Device/Splint Location: right leg Ortho Device/Splint Interventions: Application As ordered by Dr.James Colin Benton, Shayleigh Bouldin 02/09/2013, 5:17 PM

## 2013-02-09 NOTE — ED Provider Notes (Signed)
I saw and evaluated the patient, reviewed the resident's note and I agree with the findings and plan.  36yM brought in by EMS. Harnessed in tree while cutting limbs. Large limb fell pinning pt against tree. Did not fall from tree. C/o L chest and R ankle pain. On exam, airway is intact. No increased work of breathing and symmetric breath sounds bilaterally. Palpable carotid, femoral DP pulses. GCS 15. No midline cervical spine tenderness. Mild midline tenderness in the mid to lower thoracic spine. No crepitus or step-off. Superficial abrasions to the left face without underlying bony tenderness. Abrasions to left chest wall with severe tenderness. Abrasions to L abdomen. Diffuse abdominal tenderness. No distension. Pelvis stable to rocking. Superificial abrasions to b/l LE. No bony tenderness of LEE. No significant pain with ROM or L hip, knee, ankle.  Deformity to right distal tib-fib/ankle. Appears to be closed injury. Foot is warm. Palpable DP pulse. Able to move toes. Sensation intact to light touch. Fast exam was performed. Positive for fluid in the pelvis and left upper quadrant. Patient was not tachycardic nor  hypertensive, but given the positive FAST exam patient was made a TRAUMA ALERT. Given exam, high suspicion for splenic laceraton. Trauma team arrived to ED within minutes and assumed care.   Raeford Razor, MD 02/09/13 [phone]

## 2013-02-09 NOTE — Anesthesia Postprocedure Evaluation (Signed)
  Anesthesia Post-op Note  Patient: [name]  Procedure(s) Performed: Procedure(s): EXTERNAL FIXATION LEG (Right)  Patient Location: PACU  Anesthesia Type:General  Level of Consciousness: awake, alert  and oriented  Airway and Oxygen Therapy: Patient Spontanous Breathing and Patient connected to nasal cannula oxygen  Post-op Pain: mild  Post-op Assessment: Post-op Vital signs reviewed  Post-op Vital Signs: Reviewed  Complications: No apparent anesthesia complications

## 2013-02-09 NOTE — Consult Note (Signed)
ORTHOPAEDIC CONSULTATION  REQUESTING PHYSICIAN: Trauma Md, MD  Chief Complaint: Right ankle pain   HPI: [name] is a 37 y.o. male who complains of  and right ankle pain after being between the 2 trees. I was called while he was on the table during emergency exploratory laparotomy. The patient is now in recovery room, and is able to give me a reasonably good history. He complains of 10/10 pain around the right ankle. He denies any numbness or tingling. IV pain medications of made it better. He also complains of pain in his abdomen. He denies any pain in any other location.  He does smoke 1 pack per day. His father has a history of heart disease, and diabetes, but is still alive.  History reviewed. No pertinent past medical history. No past surgical history on file. History   Social History  . Marital Status: Divorced    Spouse Name: N/A    Number of Children: N/A  . Years of Education: N/A   Social History Main Topics  . Smoking status: None  . Smokeless tobacco: None  . Alcohol Use: None  . Drug Use: None  . Sexually Active: None   Other Topics Concern  . None   Social History Narrative  . None   No family history on file. No Known Allergies Prior to Admission medications   Not on File   Ct Head Wo Contrast  02/09/2013  *RADIOLOGY REPORT*  Clinical Data:  Crush injury.  Pain.  CT HEAD WITHOUT CONTRAST CT CERVICAL SPINE WITHOUT CONTRAST  Technique:  Multidetector CT imaging of the head and cervical spine was performed following the standard protocol without intravenous contrast.  Multiplanar CT image reconstructions of the cervical spine were also generated.  Comparison:   None  CT HEAD  Findings: The brain appears normal without infarct, hemorrhage, mass lesion, mass effect, midline shift or abnormal extra-axial fluid collection.  No hydrocephalus or pneumocephalus.  The calvarium is intact.  Imaged paranasal sinuses and mastoid air cells are clear.  IMPRESSION:  Negative exam.  CT CERVICAL SPINE  Findings: There is no fracture or subluxation of the cervical spine.  No epidural hematoma is identified.  Paraspinous soft tissue structures are unremarkable.  Lung apices are clear.  IMPRESSION: Negative exam.   Original Report Authenticated By: Holley Dexter, M.D.    Ct Chest W Contrast  02/09/2013  *RADIOLOGY REPORT*  Clinical Data:  Crush injury while trimming trees.  CT CHEST, ABDOMEN AND PELVIS WITH CONTRAST  Technique:  Multidetector CT imaging of the chest, abdomen and pelvis was performed following the standard protocol during bolus administration of intravenous contrast.  Contrast: OMNIPAQUE IOHEXOL 300 MG/ML  SOLN the  Comparison:   None.  CT CHEST  Findings:  No pneumothorax.  No mediastinal hematoma.  No pleural or pericardial effusion.  Mild dependent atelectasis posteriorly in both lower lobes.  Lungs otherwise clear.  Normal vascular enhancement.  Thoracic spine and sternum intact.  IMPRESSION:  1.  No acute thoracic process  CT ABDOMEN AND PELVIS  Findings:  There is high attenuation   perihepatic fluid as well as fluid in the pericolic gutters left greater than right and in the cul-de-sac.  There is active extravasation in the anterior peritoneal cavity probably from proximal SMA branch on image 88/2. Delayed scans show progressive contrast extravasation.  The stomach is physiologically distended.  Small bowel is nondilated.  There is no definite bowel wall thickening, assessment limited without any oral contrast.  The colon is decompressed. Urinary bladder incompletely distended.  There is no free air identified.  No adenopathy.  Lumbar spine intact.  Bony pelvis intact.  Unremarkable liver, spleen, kidneys, adrenal glands, pancreas. Portal vein is patent.  IMPRESSION:  1.  Active extravasation from a branch of the SMA in the right mid abdomen, with hemorrhagic ascites. I discussed the critical test results over the telephone with Dr. Lindie Spruce at the  time of interpretation.   Original Report Authenticated By: D. Andria Rhein, MD    Ct Cervical Spine Wo Contrast  02/09/2013  *RADIOLOGY REPORT*  Clinical Data:  Crush injury.  Pain.  CT HEAD WITHOUT CONTRAST CT CERVICAL SPINE WITHOUT CONTRAST  Technique:  Multidetector CT imaging of the head and cervical spine was performed following the standard protocol without intravenous contrast.  Multiplanar CT image reconstructions of the cervical spine were also generated.  Comparison:   None  CT HEAD  Findings: The brain appears normal without infarct, hemorrhage, mass lesion, mass effect, midline shift or abnormal extra-axial fluid collection.  No hydrocephalus or pneumocephalus.  The calvarium is intact.  Imaged paranasal sinuses and mastoid air cells are clear.  IMPRESSION: Negative exam.  CT CERVICAL SPINE  Findings: There is no fracture or subluxation of the cervical spine.  No epidural hematoma is identified.  Paraspinous soft tissue structures are unremarkable.  Lung apices are clear.  IMPRESSION: Negative exam.   Original Report Authenticated By: Holley Dexter, M.D.    Ct Abdomen Pelvis W Contrast  02/09/2013  *RADIOLOGY REPORT*  Clinical Data:  Crush injury while trimming trees.  CT CHEST, ABDOMEN AND PELVIS WITH CONTRAST  Technique:  Multidetector CT imaging of the chest, abdomen and pelvis was performed following the standard protocol during bolus administration of intravenous contrast.  Contrast: OMNIPAQUE IOHEXOL 300 MG/ML  SOLN the  Comparison:   None.  CT CHEST  Findings:  No pneumothorax.  No mediastinal hematoma.  No pleural or pericardial effusion.  Mild dependent atelectasis posteriorly in both lower lobes.  Lungs otherwise clear.  Normal vascular enhancement.  Thoracic spine and sternum intact.  IMPRESSION:  1.  No acute thoracic process  CT ABDOMEN AND PELVIS  Findings:  There is high attenuation   perihepatic fluid as well as fluid in the pericolic gutters left greater than right and in  the cul-de-sac.  There is active extravasation in the anterior peritoneal cavity probably from proximal SMA branch on image 88/2. Delayed scans show progressive contrast extravasation.  The stomach is physiologically distended.  Small bowel is nondilated.  There is no definite bowel wall thickening, assessment limited without any oral contrast.  The colon is decompressed. Urinary bladder incompletely distended.  There is no free air identified.  No adenopathy.  Lumbar spine intact.  Bony pelvis intact.  Unremarkable liver, spleen, kidneys, adrenal glands, pancreas. Portal vein is patent.  IMPRESSION:  1.  Active extravasation from a branch of the SMA in the right mid abdomen, with hemorrhagic ascites. I discussed the critical test results over the telephone with Dr. Lindie Spruce at the time of interpretation.   Original Report Authenticated By: D. Andria Rhein, MD    Dg Pelvis Portable  02/09/2013  *RADIOLOGY REPORT*  Clinical Data: Blunt trauma, level I trauma  PORTABLE PELVIS  Comparison: None.  Findings: Hips are located.  No evidence of pelvic fracture or sacral fracture.  IMPRESSION: No evidence of pelvic fracture.   Original Report Authenticated By: Genevive Bi, M.D.    Dg Chest Portable 1  View  02/09/2013  *RADIOLOGY REPORT*  Clinical Data: :  Trauma, blunt trauma  PORTABLE CHEST - 1 VIEW  Comparison: None.  Findings: The cardiac silhouette is normal.  No evidence of pulmonary contusion or pleural fluid.  No pneumothorax.  No evidence of rib fracture or clavicle fracture.  IMPRESSION: No radiographic evidence of thoracic trauma.   Original Report Authenticated By: Genevive Bi, M.D.    Dg Ankle Right Port  02/09/2013  *RADIOLOGY REPORT*  Clinical Data: Level I trauma.  Ankle deformity  PORTABLE RIGHT ANKLE - 2 VIEW  Comparison: None.  Findings: There are two portable views of the right ankle provided. There is a severely comminuted fracture of the distal tibial metaphysis which extends to the articular  surface.  There is a fracture of the distal fibula.  On the AP view there appears to be malalignment of the calcaneus with the talus.  Suspicion for dislocation.  IMPRESSION:  Severely comminuted intra-articular fracture of the distal tibia.  Fracture of the distal fibula.  Concern for dislocation.  Limited views.   Original Report Authenticated By: Genevive Bi, M.D.     Positive ROS: All other systems have been reviewed and were otherwise negative with the exception of those mentioned in the HPI and as above.  Physical Exam: General: Alert, no acute distress mild distress, in a c-collar. He answers questions appropriately.  Cardiovascular: Significant pedal edema in the right leg. Respiratory: No cyanosis, no use of accessory musculature GI: he has an abdominal dressing in place along with the NG tube. Skin : There is no evidence for open fracture over the ankle.  Neurologic: Sensation intact distally grossly  Psychiatric: Patient is competent for consent with normal mood and affect. I've also spoken with his family/ex-wife.  Lymphatic:  unable to be examined as he is in a cervical collar. I do not appreciate any axillary lymphadenopathy.  MUSCULOSKELETAL: Right ankle is in a splint, but does have significant soft tissue swelling with gross deformity and varus alignment.    Assessment:  right distal tibia/[place] fracture with significant shortening, displacement, and angulation. He also had a mesenteric injury, which he has stabilized from. He has just recovered from his exploratory laparotomy.  Plan:  this is an acute severe injury, which is certain to have permanent ramifications on ambulatory function. He has high risk for posttraumatic arthritis. There is severe displacement soft tissue injury, and I have recommended emergent application of an external fixator in order to optimize the soft tissue. I have discussed his case with Dr. Victorino Dike, who recommended placement of the external  fixator tonight, as after 24 hours, the ability to restore length is significantly impaired. Additionally, the crush injury to the soft tissue would be optimized by timely application of the external fixator. He is going to need a staged procedure, with subsequent reconstruction of the distal tibial articular surface.  The risks benefits and alternatives were discussed with the patient including but not limited to the risks of nonoperative treatment, versus surgical intervention including infection, bleeding, nerve injury, malunion, nonunion, the need for revision surgery, hardware prominence, hardware failure, the need for hardware removal, blood clots, cardiopulmonary complications, morbidity, mortality, among others, and they were willing to proceed.       Eulas Post, MD Cell [phone] Pager [phone]  02/09/2013 8:36 PM

## 2013-02-09 NOTE — ED Notes (Signed)
Pt to CT with RN accompanyment 

## 2013-02-09 NOTE — Op Note (Signed)
OPERATIVE REPORT  DATE OF OPERATION: 02/09/2013  PATIENT:  [name]  37 y.o. male  PRE-OPERATIVE DIAGNOSIS:  Internal Bleeding/Hemoperitoneum  POST-OPERATIVE DIAGNOSIS:  Internal Bleeding/Samll bowel mesenteric laceration  PROCEDURE:  Procedure(s): EXPLORATORY LAPAROTOMY with small bowel resection and repair of mesentery  SURGEON:  Surgeon(s): Cherylynn Ridges, MD Emelia Loron, MD  ASSISTANT: Dwain Sarna  ANESTHESIA:   general  EBL: 1,000 ml  BLOOD ADMINISTERED: 700 CC PRBC and 500 CC CELLSAVER  DRAINS: Nasogastric Tube and Urinary Catheter (Foley)   SPECIMEN:  Source of Specimen:  small bowel, ileum  COUNTS CORRECT:  YES  PROCEDURE DETAILS: The patient was taken to the operating room urgently from the trauma resuscitation room in the CT scanner. He then involved in an accident where a treatment on had hit his abdomen very hard, had a positive F AST examination, and a CT scan demonstrated active extravasation of blood and what appeared to be the small bowel mesentery.  He was prepped and draped in usual sterile manner. After proper time out was performed identifying the patient and the procedure to be performed, a midline incision was made from the xiphoid down to below the umbilicus. It was taken down to and through the midline fascia using electrocautery. Once we entered the peritoneal cavity does a large amount of blood clotted and unclotted. We started expiration in the left upper quadrant where the spleen was palpably normal. We packed that area with 4 x 4 gauze and aspirated all the blood into the Cell Saver. We came down the left paracolic gutter down into the pelvis so there was also a large amount of old blood with clots. We packed the pelvis. We then explored the right upper quadrant above the liver where there was a large amount of blood but no apparent injury to the liver itself. Exploring this area further demonstrated a very large rate or tear in the small  bowel mesentery in the mid to distal ileum. There was active bleeding from this area. We clamped the open exposed for mesentery with Kelly clamps. We then isolated the bleeding vessels and clamping with Kelly clamps transected them and then tied them off with 2-0 silk ties.  Once we had the small bowel mesentery bleeding control there was a small perhaps 6 inch segment of small bowel that was ischemic which we resected the GIA-75 stapler and a TA 60 stapler. Further upstream from this a large rent in the small bowel mesentery was a partial tear of the visceroperitoneum of the mesentery without active bleeding. This was not repaired or suture. There was no evidence of ischemia of the bowel related to this area of visceral peritoneal tear.  Further expiration demonstrated that the right paracolic area had been torn away from the retroperitoneum along with the right colon which was not injured. There was bleeding from this point mesentery and peritoneum also. This was controlled strictly with pressure and lap tapes.  Once we had the bleeding control and the small bowel had been resected we close of the mesentery using interrupted 2-0 silk sutures. We then irrigated with copious amounts of saline after the surgeon changed gowns and gloves. All once the aspirate was clear we closed the abdomen. The NG tube was palpated to be in the proper place. We closed the abdomen using a running looped #1 PDS suture. The skin was closed using stainless steel staples. All needle counts, sponge counts, and instrument counts were correct.  The patient had a comminuted right ankle  fracture which was not open. The orthopedic surgeon on-call was advised about this while the patient was in the operating room.  PATIENT DISPOSITION:  PACU - guarded condition.   Cherylynn Ridges 3/5/20147:08 PM

## 2013-02-09 NOTE — Op Note (Signed)
02/09/2013  10:24 PM  PATIENT:  [name]    PRE-OPERATIVE DIAGNOSIS:  Right Pilon Fracture  POST-OPERATIVE DIAGNOSIS:  Same  PROCEDURE:  EXTERNAL FIXATION RIGHT LEG, biplanar  SURGEON:  Eulas Post, MD  PHYSICIAN ASSISTANT: Janace Litten, OPA-C, present and scrubbed throughout the case, critical for completion in a timely fashion, and for retraction, instrumentation, and closure.  ANESTHESIA:   General  PREOPERATIVE INDICATIONS:  [name] is a  37 y.o. male with a diagnosis of Right Pilon Fracture who elected for surgical management.  He was crushed between 2 trees, and had a severely comminuted pilon fracture.  The risks benefits and alternatives were discussed with the patient preoperatively including but not limited to the risks of infection, bleeding, nerve injury, cardiopulmonary complications, the need for revision surgery, among others, and the patient was willing to proceed. We also discussed the fact that he will need staged operations, due to the severity of the soft tissue injury, and his next operation would be approximately 1-2 weeks depending on the condition of the soft tissues.  OPERATIVE IMPLANTS: Synthes large external fixator  OPERATIVE FINDINGS: Severe comminution of the distal tibia, with a large portion of the articular segment which was impacted and flipped almost 180, and central within the metaphysis of the tibia.  OPERATIVE PROCEDURE: The patient is brought to the operating room and placed in the supine position. General anesthesia was administered. IV antibiotics had artery been given for his exploratory laparotomy, and I did order another gram of Ancef. The right lower extremity was prepped and draped in usual sterile fashion. Time out was performed. 2 pins were placed through the proximal tibia. I then placed trans-calcaneal pin. C-arm guidance was used to confirm position.  I then applied a bar on either side of the leg, and then  manipulated the fracture as well as the leg, applying distraction, reduction maneuvers manually, bringing the fibula out to length, and restoring the alignment of the tibia as best as possible. There was a large articular segment was flipped 180, and I used a spinal needle to localize it percutaneously, and made a very small incision, and introduced a Freer percutaneously to try and reduce this fragment. I was not able to adequately control it, so I abandoned this. It will need to be reduced when the fracture is opened for definitive fixation.  I had overall satisfactory alignment and position, and restoration of appropriate soft tissue tension and I irrigated the small hole that I created for the [place], and repaired with nylon.  Final C-arm pictures were taken, and a slab of posterior plaster was applied that was well padded. This was to help augment the neutral position of the foot. The foot was at 90. We applied a loose dressing, and the patient was awakened and returned to PACU in stable and satisfactory condition. There were no complications and he tolerated the procedure well.

## 2013-02-09 NOTE — OR Nursing (Signed)
No procedural consent form filled out. Dr. Lindie Spruce aware, emergency consent used.

## 2013-02-09 NOTE — ED Notes (Signed)
Wife given patients belongings including cell phone

## 2013-02-09 NOTE — H&P (Addendum)
[name] is an 37 y.o. male.   Chief Complaint: Positive FAST with blunt trauma to chest and abdomen HPI: The patient is a tree cutter.  Impinged between a cut tree limb and the tree, crushing his left chest and abdomen.  Had positive FAST examination by EDP, confirmed by TS.  No past medical history on file.  No past surgical history on file.  No family history on file. Social History:  has no tobacco, alcohol, and drug history on file.  Allergies: No Known Allergies   (Not in a hospital admission)  Results for orders placed during the hospital encounter of 02/09/13 (from the past 48 hour(s))  TYPE AND SCREEN     Status: None   Collection Time    02/09/13  4:30 PM      Result Value Range   ABO/RH(D) PENDING     Antibody Screen PENDING     Sample Expiration 02/12/2013     Unit Number [id number]     Blood Component Type RED CELLS,LR     Unit division 00     Status of Unit ISSUED     Unit tag comment VERBAL ORDERS PER DR KOHUT     Transfusion Status OK TO TRANSFUSE     Crossmatch Result PENDING     Unit Number [id number]     Blood Component Type RBC LR PHER2     Unit division 00     Status of Unit ISSUED     Unit tag comment VERBAL ORDERS PER DR KOHUT     Transfusion Status OK TO TRANSFUSE     Crossmatch Result PENDING     No results found.  Review of Systems  Constitutional: Negative.   HENT: Negative.   Eyes: Negative.   Respiratory: Negative.   Cardiovascular: Negative.   Gastrointestinal: Negative.   Genitourinary: Negative.   Musculoskeletal: Negative.   Skin: Negative.   Neurological: Positive for loss of consciousness.    Blood pressure 123/79, pulse 111, temperature 98.1 F (36.7 C), temperature source Oral, resp. rate 24, SpO2 100.00%. Physical Exam  Constitutional: He is oriented to person, place, and time. He appears well-developed and well-nourished.  HENT:  Head: Normocephalic.    Eyes: Conjunctivae and EOM are normal. Pupils  are equal, round, and reactive to light.  Neck: Normal range of motion. Neck supple.  Cardiovascular: Normal rate, regular rhythm and normal heart sounds.   Respiratory: Effort normal and breath sounds normal. He exhibits mass and tenderness. He exhibits no crepitus and no deformity.    GI: Soft. He exhibits no mass. Bowel sounds are decreased. There is tenderness in the left upper quadrant. There is no rebound and no guarding.    Neurological: He is alert and oriented to person, place, and time. He has normal reflexes.  Skin: Skin is warm and dry.  Psychiatric: He has a normal mood and affect. His behavior is normal. Judgment and thought content normal.         Assessment/Plan CT scan is pending of the head, C-spine, chest, abdomen and pelvis. He has been hemodynamically stable, getting IVFs at 100cc/hr. CXR shows no obvious rib fractures, no hemothorax, no pneumothorax Pelvic X-rays is normal Right ankle X-ray, comminuted right ankle fracture.  CT of the abdomen demonstrated active extravasation of blood, likely coming from the small bowel mesentery.  Will take the patient urgently to the OR for exploratory laparotomy.  Will call orthopedic surgeon from the OR.  Cherylynn Ridges 02/09/2013, 4:49 PM

## 2013-02-10 ENCOUNTER — Inpatient Hospital Stay (HOSPITAL_COMMUNITY): Payer: MEDICAID

## 2013-02-10 ENCOUNTER — Inpatient Hospital Stay (HOSPITAL_COMMUNITY): Payer: Self-pay

## 2013-02-10 ENCOUNTER — Encounter (HOSPITAL_COMMUNITY): Payer: Self-pay | Admitting: *Deleted

## 2013-02-10 DIAGNOSIS — IMO0002 Reserved for concepts with insufficient information to code with codable children: Secondary | ICD-10-CM

## 2013-02-10 DIAGNOSIS — S82871A Displaced pilon fracture of right tibia, initial encounter for closed fracture: Secondary | ICD-10-CM

## 2013-02-10 DIAGNOSIS — S3991XA Unspecified injury of abdomen, initial encounter: Secondary | ICD-10-CM

## 2013-02-10 DIAGNOSIS — S36899A Unspecified injury of other intra-abdominal organs, initial encounter: Secondary | ICD-10-CM

## 2013-02-10 DIAGNOSIS — D62 Acute posthemorrhagic anemia: Secondary | ICD-10-CM | POA: Diagnosis not present

## 2013-02-10 LAB — CBC
HCT: 36.3 % — ABNORMAL LOW (ref 39.0–52.0)
Hemoglobin: 13.2 g/dL (ref 13.0–17.0)
MCH: 30.6 pg (ref 26.0–34.0)
MCHC: 36.4 g/dL — ABNORMAL HIGH (ref 30.0–36.0)
MCV: 84 fL (ref 78.0–100.0)
Platelets: 161 10*3/uL (ref 150–400)
RBC: 4.32 MIL/uL (ref 4.22–5.81)
RDW: 12.9 % (ref 11.5–15.5)
WBC: 13 10*3/uL — ABNORMAL HIGH (ref 4.0–10.5)

## 2013-02-10 LAB — BASIC METABOLIC PANEL
BUN: 10 mg/dL (ref 6–23)
CO2: 25 mEq/L (ref 19–32)
Calcium: 8.1 mg/dL — ABNORMAL LOW (ref 8.4–10.5)
Creatinine, Ser: 0.85 mg/dL (ref 0.50–1.35)
Glucose, Bld: 157 mg/dL — ABNORMAL HIGH (ref 70–99)

## 2013-02-10 LAB — BASIC METABOLIC PANEL WITH GFR
Chloride: 102 meq/L (ref 96–112)
GFR calc Af Amer: >90 mL/min (ref >90)
GFR calc non Af Amer: >90 mL/min (ref >90)
Potassium: 4.2 meq/L (ref 3.5–5.1)
Sodium: 134 meq/L — ABNORMAL LOW (ref 135–145)

## 2013-02-10 MED ORDER — PNEUMOCOCCAL VAC POLYVALENT 25 MCG/0.5ML IJ INJ
0.5000 mL | INJECTION | INTRAMUSCULAR | Status: AC
  Filled 2013-02-10: qty 0.5

## 2013-02-10 MED ORDER — INFLUENZA VIRUS VACC SPLIT PF IM SUSP
0.5000 mL | INTRAMUSCULAR | Status: AC
  Filled 2013-02-10: qty 0.5

## 2013-02-10 MED ORDER — BACITRACIN ZINC 500 UNIT/GM EX OINT
TOPICAL_OINTMENT | Freq: Two times a day (BID) | CUTANEOUS | Status: DC
  Administered 2013-02-10 – 2013-02-19 (×17): via TOPICAL
  Filled 2013-02-10 (×2): qty 15

## 2013-02-10 MED FILL — Sodium Chloride Irrigation Soln 0.9%: Qty: 3000 | Status: AC

## 2013-02-10 MED FILL — Heparin Sodium (Porcine) Inj 1000 Unit/ML: INTRAMUSCULAR | Qty: 30 | Status: AC

## 2013-02-10 MED FILL — Sodium Chloride IV Soln 0.9%: INTRAVENOUS | Qty: 1000 | Status: AC

## 2013-02-10 NOTE — Progress Notes (Signed)
Patient ID: [name], male   DOB: 1976/02/23, 37 y.o.   MRN: [id number] 1 Day Post-Op  Subjective: Somewhat sleepy, sore  Objective: Vital signs in last 24 hours: Temp:  [97.6 F (36.4 C)-98.2 F (36.8 C)] 98.1 F (36.7 C) (03/06 0804) Pulse Rate:  [76-116] 105 (03/06 0700) Resp:  [10-27] 17 (03/06 0700) BP: (62-140)/(40-94) 123/83 mmHg (03/06 0700) SpO2:  [88 %-100 %] 96 % (03/06 0700) Arterial Line BP: (117-149)/(66-85) 124/70 mmHg (03/06 0700) Weight:  [94.9 kg (209 lb 3.5 oz)] 94.9 kg (209 lb 3.5 oz) (03/06 0600)    Intake/Output from previous day: 03/05 0701 - 03/06 0700 In: 6356.9 [I.V.:4796.9; Blood:1200; NG/GT:60; IV Piggyback:300] Out: 2000 [Urine:1050; Blood:800] Intake/Output this shift:    General appearance: cooperative Head: facial abrasions B Neck: no posterior midline tenderness, +pain with AROM - collar left on Resp: clear to auscultation bilaterally Cardio: regular rate and rhythm GI: soft, quiet, dressing CDI, abrasion L abdominal wall, expected incisional tenderness Extremities: Ex Fix RLE, toes warm and move to command Neuro: arouses and F/C  Lab Results: CBC   Recent Labs  02/09/13 1639  02/09/13 1823 02/10/13 0300  WBC 20.6*  --   --  13.0*  HGB 15.6  < > 10.5* 13.2  HCT 43.0  < > 31.0* 36.3*  PLT 301  --   --  161  < > = values in this interval not displayed. BMET  Recent Labs  02/09/13 1639 02/09/13 1652 02/09/13 1823 02/10/13 0300  NA 137 138 137 134*  K 3.0* 2.9* 3.6 4.2  CL 96 102  --  102  CO2 23  --   --  25  GLUCOSE 186* 182*  --  157*  BUN 11 11  --  10  CREATININE 1.09 1.00  --  0.85  CALCIUM 10.1  --   --  8.1*   PT/INR  Recent Labs  02/09/13 1639  LABPROT 14.0  INR 1.09   ABG  Recent Labs  02/09/13 1823  PHART 7.313*  HCO3 26.2*    Studies/Results: Dg Ankle Complete Right  02/09/2013  *RADIOLOGY REPORT*  Clinical Data: External fixation of right ankle fracture.  RIGHT ANKLE - COMPLETE 3+ VIEW   Comparison: Right ankle radiographs performed earlier today at 04:58 p.m.  Findings: Six fluoroscopic C-arm images are provided from the OR. These demonstrate successful external fixation of the right lower extremity, with screws along the proximal to mid tibia, and at the calcaneus.  The significantly comminuted fracture at the distal tibia demonstrates improved alignment, though there is still significant dorsal displacement of fragments.  IMPRESSION: Successful external fixation of right lower extremity; improved alignment of significantly comminuted fracture of the distal tibia, though there is still dorsal displacement of fragments.   Original Report Authenticated By: Tonia Ghent, M.D.    Ct Head Wo Contrast  02/09/2013  *RADIOLOGY REPORT*  Clinical Data:  Crush injury.  Pain.  CT HEAD WITHOUT CONTRAST CT CERVICAL SPINE WITHOUT CONTRAST  Technique:  Multidetector CT imaging of the head and cervical spine was performed following the standard protocol without intravenous contrast.  Multiplanar CT image reconstructions of the cervical spine were also generated.  Comparison:   None  CT HEAD  Findings: The brain appears normal without infarct, hemorrhage, mass lesion, mass effect, midline shift or abnormal extra-axial fluid collection.  No hydrocephalus or pneumocephalus.  The calvarium is intact.  Imaged paranasal sinuses and mastoid air cells are clear.  IMPRESSION: Negative exam.  CT  CERVICAL SPINE  Findings: There is no fracture or subluxation of the cervical spine.  No epidural hematoma is identified.  Paraspinous soft tissue structures are unremarkable.  Lung apices are clear.  IMPRESSION: Negative exam.   Original Report Authenticated By: Holley Dexter, M.D.    Ct Chest W Contrast  02/09/2013  *RADIOLOGY REPORT*  Clinical Data:  Crush injury while trimming trees.  CT CHEST, ABDOMEN AND PELVIS WITH CONTRAST  Technique:  Multidetector CT imaging of the chest, abdomen and pelvis was performed following  the standard protocol during bolus administration of intravenous contrast.  Contrast: OMNIPAQUE IOHEXOL 300 MG/ML  SOLN the  Comparison:   None.  CT CHEST  Findings:  No pneumothorax.  No mediastinal hematoma.  No pleural or pericardial effusion.  Mild dependent atelectasis posteriorly in both lower lobes.  Lungs otherwise clear.  Normal vascular enhancement.  Thoracic spine and sternum intact.  IMPRESSION:  1.  No acute thoracic process  CT ABDOMEN AND PELVIS  Findings:  There is high attenuation   perihepatic fluid as well as fluid in the pericolic gutters left greater than right and in the cul-de-sac.  There is active extravasation in the anterior peritoneal cavity probably from proximal SMA branch on image 88/2. Delayed scans show progressive contrast extravasation.  The stomach is physiologically distended.  Small bowel is nondilated.  There is no definite bowel wall thickening, assessment limited without any oral contrast.  The colon is decompressed. Urinary bladder incompletely distended.  There is no free air identified.  No adenopathy.  Lumbar spine intact.  Bony pelvis intact.  Unremarkable liver, spleen, kidneys, adrenal glands, pancreas. Portal vein is patent.  IMPRESSION:  1.  Active extravasation from a branch of the SMA in the right mid abdomen, with hemorrhagic ascites. I discussed the critical test results over the telephone with Dr. Lindie Spruce at the time of interpretation.   Original Report Authenticated By: D. Andria Rhein, MD    Ct Cervical Spine Wo Contrast  02/09/2013  *RADIOLOGY REPORT*  Clinical Data:  Crush injury.  Pain.  CT HEAD WITHOUT CONTRAST CT CERVICAL SPINE WITHOUT CONTRAST  Technique:  Multidetector CT imaging of the head and cervical spine was performed following the standard protocol without intravenous contrast.  Multiplanar CT image reconstructions of the cervical spine were also generated.  Comparison:   None  CT HEAD  Findings: The brain appears normal without infarct,  hemorrhage, mass lesion, mass effect, midline shift or abnormal extra-axial fluid collection.  No hydrocephalus or pneumocephalus.  The calvarium is intact.  Imaged paranasal sinuses and mastoid air cells are clear.  IMPRESSION: Negative exam.  CT CERVICAL SPINE  Findings: There is no fracture or subluxation of the cervical spine.  No epidural hematoma is identified.  Paraspinous soft tissue structures are unremarkable.  Lung apices are clear.  IMPRESSION: Negative exam.   Original Report Authenticated By: Holley Dexter, M.D.    Ct Abdomen Pelvis W Contrast  02/09/2013  *RADIOLOGY REPORT*  Clinical Data:  Crush injury while trimming trees.  CT CHEST, ABDOMEN AND PELVIS WITH CONTRAST  Technique:  Multidetector CT imaging of the chest, abdomen and pelvis was performed following the standard protocol during bolus administration of intravenous contrast.  Contrast: OMNIPAQUE IOHEXOL 300 MG/ML  SOLN the  Comparison:   None.  CT CHEST  Findings:  No pneumothorax.  No mediastinal hematoma.  No pleural or pericardial effusion.  Mild dependent atelectasis posteriorly in both lower lobes.  Lungs otherwise clear.  Normal vascular enhancement.  Thoracic spine and sternum intact.  IMPRESSION:  1.  No acute thoracic process  CT ABDOMEN AND PELVIS  Findings:  There is high attenuation   perihepatic fluid as well as fluid in the pericolic gutters left greater than right and in the cul-de-sac.  There is active extravasation in the anterior peritoneal cavity probably from proximal SMA branch on image 88/2. Delayed scans show progressive contrast extravasation.  The stomach is physiologically distended.  Small bowel is nondilated.  There is no definite bowel wall thickening, assessment limited without any oral contrast.  The colon is decompressed. Urinary bladder incompletely distended.  There is no free air identified.  No adenopathy.  Lumbar spine intact.  Bony pelvis intact.  Unremarkable liver, spleen, kidneys, adrenal  glands, pancreas. Portal vein is patent.  IMPRESSION:  1.  Active extravasation from a branch of the SMA in the right mid abdomen, with hemorrhagic ascites. I discussed the critical test results over the telephone with Dr. Lindie Spruce at the time of interpretation.   Original Report Authenticated By: D. Andria Rhein, MD    Ct Ankle Right Wo Contrast  02/10/2013  *RADIOLOGY REPORT*  Clinical Data: Ankle fracture status post external fixation.  CT OF THE RIGHT ANKLE WITH CONTRAST  Technique:  Multidetector CT imaging was performed following the standard protocol during bolus administration of intravenous contrast.  Comparison: Radiographs 02/09/2013.  Findings: The ankle and lower leg are splinted and in external fixation. External fixator extends through the calcaneal tuberosity.  There is improved alignment of the extensively comminuted intra-articular fracture of the distal tibia.  There is up to 3 mm of depression of the articular surface of the central tibial plafond.  The fracture extends through the posterior aspect of the medial malleolus and results in 11 mm of anterior displacement of the medial malleolus.  Multiple fracture fragments are present within the joint, including the posterior recess.  There is an oblique mildly comminuted fracture of the distal fibula just proximal to the ankle mortise.  This is associated with 6 mm of anterior displacement.  The distal tibiofibular joint appears intact.  The talar dome and subtalar joint appear intact. The additional visualized tarsal bones are intact.  Soft tissue windows demonstrate potential entrapment of the tibialis posterior and flexor digitorum longus tendons within the tibial fracture just posterior to the medial malleolus.  The peroneal tendons are also in close proximity to the distal fibular fracture, but demonstrate no entrapment.  IMPRESSION:  1.  Improved alignment of extensively comminuted intra-articular fracture of the distal tibia status post  external fixation.  There is 3 mm of depression of the articular surface centrally. 2.  There are multiple intra-articular fracture fragments.  There is possible entrapment of the medial flexor tendons. 3.  Mild displacement of distal fibular fracture.   Original Report Authenticated By: Carey Bullocks, M.D.    Dg Pelvis Portable  02/09/2013  *RADIOLOGY REPORT*  Clinical Data: Blunt trauma, level I trauma  PORTABLE PELVIS  Comparison: None.  Findings: Hips are located.  No evidence of pelvic fracture or sacral fracture.  IMPRESSION: No evidence of pelvic fracture.   Original Report Authenticated By: Genevive Bi, M.D.    Dg Chest Portable 1 View  02/09/2013  *RADIOLOGY REPORT*  Clinical Data: :  Trauma, blunt trauma  PORTABLE CHEST - 1 VIEW  Comparison: None.  Findings: The cardiac silhouette is normal.  No evidence of pulmonary contusion or pleural fluid.  No pneumothorax.  No evidence of rib fracture or clavicle  fracture.  IMPRESSION: No radiographic evidence of thoracic trauma.   Original Report Authenticated By: Genevive Bi, M.D.    Dg Ankle Right Port  02/09/2013  *RADIOLOGY REPORT*  Clinical Data: Level I trauma.  Ankle deformity  PORTABLE RIGHT ANKLE - 2 VIEW  Comparison: None.  Findings: There are two portable views of the right ankle provided. There is a severely comminuted fracture of the distal tibial metaphysis which extends to the articular surface.  There is a fracture of the distal fibula.  On the AP view there appears to be malalignment of the calcaneus with the talus.  Suspicion for dislocation.  IMPRESSION:  Severely comminuted intra-articular fracture of the distal tibia.  Fracture of the distal fibula.  Concern for dislocation.  Limited views.   Original Report Authenticated By: Genevive Bi, M.D.     Anti-infectives: Anti-infectives   Start     Dose/Rate Route Frequency Ordered Stop   02/10/13 0400  ceFAZolin (ANCEF) IVPB 1 g/50 mL premix     1 g 100 mL/hr over 30 Minutes  Intravenous Every 6 hours 02/09/13 2356 02/10/13 2159   02/09/13 2052  ceFAZolin (ANCEF) 1-5 GM-% IVPB    Comments:  REES, DAVID: cabinet override      02/09/13 2052 02/10/13 0859   02/09/13 2046  [MAR Hold]  ceFAZolin (ANCEF) IVPB 1 g/50 mL premix     (On MAR Hold since 02/09/13 2107)   1 g 100 mL/hr over 30 Minutes Intravenous 30 min pre-op 02/09/13 2047 02/09/13 2123   02/09/13 1815  [MAR Hold]  ceFAZolin (ANCEF) IVPB 2 g/50 mL premix  Status:  Discontinued     (On MAR Hold since 02/09/13 2107)   2 g 100 mL/hr over 30 Minutes Intravenous To Surgery 02/09/13 1811 02/09/13 2356      Assessment/Plan: Struck by tree S/P SBR for mesenteric tear - NGT, await return of bowel function Abdominal wall and facial abrasions - local care Cervical strain - flex ex c-spine films, collar for now R comminuted distal tib fib Fx - Ex Fix placed by Dr. Dion Saucier VTE - PAS, start lovenox 3/7 if Hb stable To 6N   LOS: 1 day    Violeta Gelinas, MD, MPH, FACS Pager: [phone]  02/10/2013

## 2013-02-10 NOTE — Clinical Social Work Psychosocial (Signed)
     Clinical Social Work Department BRIEF PSYCHOSOCIAL ASSESSMENT 02/10/2013  Patient:  [name], [name]     Account Number:  [id number]     Admit date:  02/09/2013  Clinical Social Worker:  Verl Blalock  Date/Time:  02/10/2013 02:30 PM  Referred by:  Physician  Date Referred:  02/10/2013 Referred for  Psychosocial assessment   Other Referral:   Interview type:  Family Other interview type:   Patient with limited engagement due to recent pain medications    PSYCHOSOCIAL DATA Living Status:  FAMILY Admitted from facility:   Level of care:   Primary support name:  [name], [name]  [phone] Primary support relationship to patient:  SPOUSE Degree of support available:   Strong    CURRENT CONCERNS Current Concerns  None Noted   Other Concerns:    SOCIAL WORK ASSESSMENT / PLAN Clinical Social Worker met with patient and patient family at the bedside to offer support and discuss patient needs at discharge.  Patient mother states that patient was crushed while doing work in a tree.  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  Patient currently lives with his wife and children and plans to return home at discharge with family support.  CSW to follow up to complete SBIRT assessment once patient able to fully engage in conversation.  CSW remains available for support and to facilitate necessary discharge plans.   Assessment/plan status:  Psychosocial Support/Ongoing Assessment of Needs Other assessment/ plan:   Information/referral to community resources:   No resources provided at this time - will continue to follow up through patient hospitalization.    PATIENTS/FAMILYS RESPONSE TO PLAN OF CARE: Patient alert and oriented x3 but with limited engagement in conversation due to recent pain medication.  Patient family at bedside and were able to give adequate information for patient current situation.  CSW to follow up further with patient once more  appropriate.  Patient family verbalized their appreciation for CSW support.

## 2013-02-10 NOTE — Progress Notes (Signed)
Patient ID: [name], male   DOB: 05/23/1976, 37 y.o.   MRN: [id number]     Subjective:  Patient reports pain as mild to moderate.  Patient is alert and follows commands.   Objective:   VITALS:   Filed Vitals:   02/10/13 0800 02/10/13 0804 02/10/13 0900 02/10/13 1000  BP: 120/80  126/79 120/73  Pulse: 106  105 107  Temp:  98.1 F (36.7 C)    TempSrc:  Oral    Resp: 16  18 20   Height:      Weight:      SpO2: 96%  96% 95%    ABD soft Sensation intact distally Dorsiflexion/Plantar flexion intact Incision: dressing C/D/I and no drainage   Lab Results  Component Value Date   WBC 13.0* 02/10/2013   HGB 13.2 02/10/2013   HCT 36.3* 02/10/2013   MCV 84.0 02/10/2013   PLT 161 02/10/2013     Assessment/Plan: 1 Day Post-Op   Active Problems:   Blunt abdominal trauma   Injury of mesentery   Acute blood loss anemia   Closed right pilon fracture   Continue plan per trauma service NWB right lower ext. Will have Dr Victorino Dike evaluate for fixation for his distal tibia/fibula.   Haskel Khan 02/10/2013, 10:50 AM   Teryl Lucy, MD Cell [phone] Pager [phone]

## 2013-02-10 NOTE — Progress Notes (Signed)
Physical Therapy Evaluation Patient Details Name: [name] MRN: [id number] DOB: 12/28/1975 Today's Date: 02/10/2013 Time: 4098-1191 PT Time Calculation (min): 25 min  PT Assessment / Plan / Recommendation Clinical Impression  37 yo M admitted with trauma from a tree cutting accident.  Presents to PT with multiple injuries and was very limited with mobility today.  Pt will benefit from PT in the acute care setting to improve mobility to allow for him to safely DC home with assist from his family.      PT Assessment  Patient needs continued PT services    Follow Up Recommendations  Home health PT    Does the patient have the potential to tolerate intense rehabilitation      Barriers to Discharge        Equipment Recommendations  Rolling walker with 5" wheels (may progress to crutches)    Recommendations for Other Services     Frequency Min 5X/week    Precautions / Restrictions Precautions Required Braces or Orthoses: Cervical Brace Cervical Brace: Hard collar Restrictions Weight Bearing Restrictions: Yes RLE Weight Bearing: Non weight bearing   Pertinent Vitals/Pain 7-8/10  In abdomen and lower RLE.  Pt using PCA      Mobility  Bed Mobility Bed Mobility: Supine to Sit Supine to Sit: 3: Mod assist (second person to assist with RLE) Transfers Transfers: Unable to transfer secondary to pt became diaphoretic and oxygen sats down to 82%.  Pt requesting to lay back down as he was unable to continue.   Ambulation/Gait Ambulation/Gait Assistance: Not tested (comment)    Exercises Other Exercises Other Exercises: Ankle pumps on LLE.     Discussed with pt the importance of elevation of RLE as well as importance of sitting up and finding the balance between the two.    PT Diagnosis: Difficulty walking  PT Problem List: Decreased mobility;Decreased knowledge of use of DME;Decreased activity tolerance;Pain PT Treatment Interventions: DME instruction;Gait  training;Stair training;Therapeutic exercise;Patient/family education   PT Goals Acute Rehab PT Goals PT Goal Formulation: With patient Time For Goal Achievement: 02/17/13 Potential to Achieve Goals: Good Pt will go Supine/Side to Sit: with min assist PT Goal: Supine/Side to Sit - Progress: Goal set today Pt will go Sit to Stand: with supervision PT Goal: Sit to Stand - Progress: Goal set today Pt will Ambulate: 51 - 150 feet;with supervision;with least restrictive assistive device PT Goal: Ambulate - Progress: Goal set today Pt will Go Up / Down Stairs: 1-2 stairs;with min assist;with crutches PT Goal: Up/Down Stairs - Progress: Goal set today  Visit Information  Last PT Received On: 02/10/13 Assistance Needed: +2    Subjective Data  Patient Stated Goal: to be able to get up and down himself   Prior Functioning  Home Living Lives With: Spouse Available Help at Discharge: Family;Available 24 hours/day Type of Home: House Home Access: Stairs to enter [employer] of Steps: 2 Entrance Stairs-Rails: None Home Layout: Two level;Able to live on main level with bedroom/bathroom Alternate Level Stairs-Number of Steps: flght Home Adaptive Equipment: None Prior Function Level of Independence: Independent Able to Take Stairs?: Yes Driving: Yes Communication Communication: No difficulties    Cognition  Cognition Overall Cognitive Status: Appears within functional limits for tasks assessed/performed Arousal/Alertness: Lethargic Orientation Level: Appears intact for tasks assessed Behavior During Session: Lethargic    Extremity/Trunk Assessment Right Upper Extremity Assessment RUE ROM/Strength/Tone: [facility] for tasks assessed Left Upper Extremity Assessment LUE ROM/Strength/Tone: [facility] for tasks assessed Right Lower Extremity Assessment RLE  ROM/Strength/Tone: Deficits;Due to pain RLE ROM/Strength/Tone Deficits: External fixator to RLE Left Lower Extremity Assessment LLE  ROM/Strength/Tone: WFL for tasks assessed   Balance    End of Session PT - End of Session Equipment Utilized During Treatment: Cervical collar;Oxygen Activity Tolerance: Treatment limited secondary to medical complications (Comment) (Pt diaphoretic sitting EOB and oxygen sats decreased to 82%.) Patient left: in bed;with call bell/phone within reach;with family/visitor present Nurse Communication: Mobility status (RN present and for most of session and assisted)  GP     Donnella Sham 02/10/2013, 3:24 PM

## 2013-02-10 NOTE — ED Provider Notes (Signed)
I saw and evaluated the patient, reviewed the resident's note and I agree with the findings and plan.  Please see completed note for this encounter  Raeford Razor, MD 02/10/13 [phone]

## 2013-02-10 NOTE — Consult Note (Signed)
Reason for Consult:  Right tibial pilon fracture Referring Physician: Dr. Darrow Bussing is an 37 y.o. male.  HPI: 37 y/o male with PMH of cigarette smoking was cutting down a tree yesterday when a branch fell and pinned him against the trunk.  Pt was brought to the ER and taken urgently to the OR by the trauma surgeon due to a + FAST exam.  Dr. Dion Saucier was called while the pt was in the OR due to the right ankle injury.  Pt was taken back to the OR last night for application of external fixator and closed reduction of the tibial pilon fracture.  He c/o dull aching pain in the ankle that is worse with any attempt at motion and better with rest and elevation.  He denies any h/o injury or surgery to the right ankle in the past.  Past Medical History  Diagnosis Date  . Medical history non-contributory     Past Surgical History  Procedure Laterality Date  . Colon surgery    . Laparotomy N/A 02/09/2013    Procedure: EXPLORATORY LAPAROTOMY with small bowel resection and repair of mesentery;  Surgeon: Cherylynn Ridges, MD;  Location: [facility] OR;  Service: General;  Laterality: N/A;  . External fixation leg Right 02/09/2013    Procedure: EXTERNAL FIXATION LEG;  Surgeon: Eulas Post, MD;  Location: [facility] OR;  Service: Orthopedics;  Laterality: Right;    FH:  noncontrib.  Social History:  reports that he has been smoking Cigarettes.  He has been smoking about 0.00 packs per day. He does not have any smokeless tobacco history on file. His alcohol and drug histories are not on file.  Allergies: No Known Allergies  Medications:  None on admission.  Results for orders placed during the hospital encounter of 02/09/13 (from the past 48 hour(s))  COMPREHENSIVE METABOLIC PANEL     Status: Abnormal   Collection Time    02/09/13  4:39 PM      Result Value Range   Sodium 137  135 - 145 mEq/L   Potassium 3.0 (*) 3.5 - 5.1 mEq/L   Chloride 96  96 - 112 mEq/L   CO2 23  19 - 32 mEq/L   Glucose, Bld 186  (*) 70 - 99 mg/dL   BUN 11  6 - 23 mg/dL   Creatinine, Ser 0.98  0.50 - 1.35 mg/dL   Calcium 11.9  8.4 - 14.7 mg/dL   Total Protein 7.7  6.0 - 8.3 g/dL   Albumin 4.5  3.5 - 5.2 g/dL   AST 30  0 - 37 U/L   ALT 25  0 - 53 U/L   Alkaline Phosphatase 45  39 - 117 U/L   Total Bilirubin 0.4  0.3 - 1.2 mg/dL   GFR calc non Af Amer 87 (*) >90 mL/min   GFR calc Af Amer >90  >90 mL/min   Comment:            The eGFR has been calculated     using the CKD EPI equation.     This calculation has not been     validated in all clinical     situations.     eGFR's persistently     <90 mL/min signify     possible Chronic Kidney Disease.  CBC     Status: Abnormal   Collection Time    02/09/13  4:39 PM      Result Value Range   WBC 20.6 (*)  4.0 - 10.5 K/uL   RBC 4.97  4.22 - 5.81 MIL/uL   Hemoglobin 15.6  13.0 - 17.0 g/dL   HCT 16.1  09.6 - 04.5 %   MCV 86.5  78.0 - 100.0 fL   MCH 31.4  26.0 - 34.0 pg   MCHC 36.3 (*) 30.0 - 36.0 g/dL   RDW 40.9  81.1 - 91.4 %   Platelets 301  150 - 400 K/uL  PROTIME-INR     Status: None   Collection Time    02/09/13  4:39 PM      Result Value Range   Prothrombin Time 14.0  11.6 - 15.2 seconds   INR 1.09  0.00 - 1.49  TYPE AND SCREEN     Status: None   Collection Time    02/09/13  4:40 PM      Result Value Range   ABO/RH(D) A POS     Antibody Screen NEG     Sample Expiration 02/12/2013     Unit Number [id number]     Blood Component Type RED CELLS,LR     Unit division 00     Status of Unit REL FROM [facility]     Unit tag comment VERBAL ORDERS PER DR KOHUT     Transfusion Status OK TO TRANSFUSE     Crossmatch Result COMPATIBLE     Unit Number [id number]     Blood Component Type RBC LR PHER2     Unit division 00     Status of Unit REL FROM [facility]     Unit tag comment VERBAL ORDERS PER DR KOHUT     Transfusion Status OK TO TRANSFUSE     Crossmatch Result COMPATIBLE     Unit Number [id number]     Blood Component Type RED CELLS,LR     Unit  division 00     Status of Unit ISSUED,FINAL     Transfusion Status OK TO TRANSFUSE     Crossmatch Result Compatible     Unit Number [id number]     Blood Component Type RED CELLS,LR     Unit division 00     Status of Unit ISSUED,FINAL     Transfusion Status OK TO TRANSFUSE     Crossmatch Result Compatible     Unit Number [id number]     Blood Component Type RED CELLS,LR     Unit division 00     Status of Unit REL FROM [facility]     Transfusion Status OK TO TRANSFUSE     Crossmatch Result Compatible     Unit Number [id number]     Blood Component Type RED CELLS,LR     Unit division 00     Status of Unit REL FROM [facility]     Transfusion Status OK TO TRANSFUSE     Crossmatch Result Compatible     Unit Number [id number]     Blood Component Type RED CELLS,LR     Unit division 00     Status of Unit REL FROM [facility]     Transfusion Status OK TO TRANSFUSE     Crossmatch Result Compatible     Unit Number [id number]     Blood Component Type RED CELLS,LR     Unit division 00     Status of Unit REL FROM [facility]     Transfusion Status OK TO TRANSFUSE     Crossmatch Result Compatible     Unit Number [id number]     Blood Component Type RED CELLS,LR  Unit division 00     Status of Unit ALLOCATED     Transfusion Status OK TO TRANSFUSE     Crossmatch Result Compatible     Unit Number [id number]     Blood Component Type RED CELLS,LR     Unit division 00     Status of Unit ALLOCATED     Transfusion Status OK TO TRANSFUSE     Crossmatch Result Compatible    ABO/RH     Status: None   Collection Time    02/09/13  4:40 PM      Result Value Range   ABO/RH(D) A POS    POCT I-STAT, CHEM 8     Status: Abnormal   Collection Time    02/09/13  4:52 PM      Result Value Range   Sodium 138  135 - 145 mEq/L   Potassium 2.9 (*) 3.5 - 5.1 mEq/L   Chloride 102  96 - 112 mEq/L   BUN 11  6 - 23 mg/dL   Creatinine, Ser 1.19  0.50 - 1.35 mg/dL   Glucose, Bld 147 (*) 70 - 99 mg/dL    Calcium, Ion 8.29 (*) 1.12 - 1.23 mmol/L   TCO2 24  0 - 100 mmol/L   Hemoglobin 16.0  13.0 - 17.0 g/dL   HCT 56.2  13.0 - 86.5 %  CG4 I-STAT (LACTIC ACID)     Status: Abnormal   Collection Time    02/09/13  4:53 PM      Result Value Range   Lactic Acid, Venous 4.36 (*) 0.5 - 2.2 mmol/L  POCT I-STAT 7, (LYTES, BLD GAS, ICA,H+H)     Status: Abnormal   Collection Time    02/09/13  6:23 PM      Result Value Range   pH, Arterial 7.313 (*) 7.350 - 7.450   pCO2 arterial 51.1 (*) 35.0 - 45.0 mmHg   pO2, Arterial 477.0 (*) 80.0 - 100.0 mmHg   Bicarbonate 26.2 (*) 20.0 - 24.0 mEq/L   TCO2 28  0 - 100 mmol/L   O2 Saturation 100.0     Acid-base deficit 1.0  0.0 - 2.0 mmol/L   Sodium 137  135 - 145 mEq/L   Potassium 3.6  3.5 - 5.1 mEq/L   Calcium, Ion 1.11 (*) 1.12 - 1.23 mmol/L   HCT 31.0 (*) 39.0 - 52.0 %   Hemoglobin 10.5 (*) 13.0 - 17.0 g/dL   Patient temperature 78.4 C     Collection site RADIAL, ALLEN'S TEST ACCEPTABLE     Sample type ARTERIAL    CBC     Status: Abnormal   Collection Time    02/10/13  3:00 AM      Result Value Range   WBC 13.0 (*) 4.0 - 10.5 K/uL   RBC 4.32  4.22 - 5.81 MIL/uL   Hemoglobin 13.2  13.0 - 17.0 g/dL   Comment: POST TRANSFUSION SPECIMEN     REPEATED TO VERIFY   HCT 36.3 (*) 39.0 - 52.0 %   MCV 84.0  78.0 - 100.0 fL   MCH 30.6  26.0 - 34.0 pg   MCHC 36.4 (*) 30.0 - 36.0 g/dL   RDW 69.6  29.5 - 28.4 %   Platelets 161  150 - 400 K/uL   Comment: DELTA CHECK NOTED     REPEATED TO VERIFY     SPECIMEN CHECKED FOR CLOTS  BASIC METABOLIC PANEL     Status: Abnormal   Collection Time  02/10/13  3:00 AM      Result Value Range   Sodium 134 (*) 135 - 145 mEq/L   Potassium 4.2  3.5 - 5.1 mEq/L   Chloride 102  96 - 112 mEq/L   CO2 25  19 - 32 mEq/L   Glucose, Bld 157 (*) 70 - 99 mg/dL   BUN 10  6 - 23 mg/dL   Creatinine, Ser 9.60  0.50 - 1.35 mg/dL   Calcium 8.1 (*) 8.4 - 10.5 mg/dL   GFR calc non Af Amer >90  >90 mL/min   GFR calc Af Amer >90  >90  mL/min   Comment:            The eGFR has been calculated     using the CKD EPI equation.     This calculation has not been     validated in all clinical     situations.     eGFR's persistently     <90 mL/min signify     possible Chronic Kidney Disease.    Dg Ankle Complete Right  02/09/2013  *RADIOLOGY REPORT*  Clinical Data: External fixation of right ankle fracture.  RIGHT ANKLE - COMPLETE 3+ VIEW  Comparison: Right ankle radiographs performed earlier today at 04:58 p.m.  Findings: Six fluoroscopic C-arm images are provided from the OR. These demonstrate successful external fixation of the right lower extremity, with screws along the proximal to mid tibia, and at the calcaneus.  The significantly comminuted fracture at the distal tibia demonstrates improved alignment, though there is still significant dorsal displacement of fragments.  IMPRESSION: Successful external fixation of right lower extremity; improved alignment of significantly comminuted fracture of the distal tibia, though there is still dorsal displacement of fragments.   Original Report Authenticated By: Tonia Ghent, M.D.    Ct Head Wo Contrast  02/09/2013  *RADIOLOGY REPORT*  Clinical Data:  Crush injury.  Pain.  CT HEAD WITHOUT CONTRAST CT CERVICAL SPINE WITHOUT CONTRAST  Technique:  Multidetector CT imaging of the head and cervical spine was performed following the standard protocol without intravenous contrast.  Multiplanar CT image reconstructions of the cervical spine were also generated.  Comparison:   None  CT HEAD  Findings: The brain appears normal without infarct, hemorrhage, mass lesion, mass effect, midline shift or abnormal extra-axial fluid collection.  No hydrocephalus or pneumocephalus.  The calvarium is intact.  Imaged paranasal sinuses and mastoid air cells are clear.  IMPRESSION: Negative exam.  CT CERVICAL SPINE  Findings: There is no fracture or subluxation of the cervical spine.  No epidural hematoma is  identified.  Paraspinous soft tissue structures are unremarkable.  Lung apices are clear.  IMPRESSION: Negative exam.   Original Report Authenticated By: Holley Dexter, M.D.    Ct Chest W Contrast  02/09/2013  *RADIOLOGY REPORT*  Clinical Data:  Crush injury while trimming trees.  CT CHEST, ABDOMEN AND PELVIS WITH CONTRAST  Technique:  Multidetector CT imaging of the chest, abdomen and pelvis was performed following the standard protocol during bolus administration of intravenous contrast.  Contrast: OMNIPAQUE IOHEXOL 300 MG/ML  SOLN the  Comparison:   None.  CT CHEST  Findings:  No pneumothorax.  No mediastinal hematoma.  No pleural or pericardial effusion.  Mild dependent atelectasis posteriorly in both lower lobes.  Lungs otherwise clear.  Normal vascular enhancement.  Thoracic spine and sternum intact.  IMPRESSION:  1.  No acute thoracic process  CT ABDOMEN AND PELVIS  Findings:  There is high  attenuation   perihepatic fluid as well as fluid in the pericolic gutters left greater than right and in the cul-de-sac.  There is active extravasation in the anterior peritoneal cavity probably from proximal SMA branch on image 88/2. Delayed scans show progressive contrast extravasation.  The stomach is physiologically distended.  Small bowel is nondilated.  There is no definite bowel wall thickening, assessment limited without any oral contrast.  The colon is decompressed. Urinary bladder incompletely distended.  There is no free air identified.  No adenopathy.  Lumbar spine intact.  Bony pelvis intact.  Unremarkable liver, spleen, kidneys, adrenal glands, pancreas. Portal vein is patent.  IMPRESSION:  1.  Active extravasation from a branch of the SMA in the right mid abdomen, with hemorrhagic ascites. I discussed the critical test results over the telephone with Dr. Lindie Spruce at the time of interpretation.   Original Report Authenticated By: D. Andria Rhein, MD    Ct Cervical Spine Wo Contrast  02/09/2013   *RADIOLOGY REPORT*  Clinical Data:  Crush injury.  Pain.  CT HEAD WITHOUT CONTRAST CT CERVICAL SPINE WITHOUT CONTRAST  Technique:  Multidetector CT imaging of the head and cervical spine was performed following the standard protocol without intravenous contrast.  Multiplanar CT image reconstructions of the cervical spine were also generated.  Comparison:   None  CT HEAD  Findings: The brain appears normal without infarct, hemorrhage, mass lesion, mass effect, midline shift or abnormal extra-axial fluid collection.  No hydrocephalus or pneumocephalus.  The calvarium is intact.  Imaged paranasal sinuses and mastoid air cells are clear.  IMPRESSION: Negative exam.  CT CERVICAL SPINE  Findings: There is no fracture or subluxation of the cervical spine.  No epidural hematoma is identified.  Paraspinous soft tissue structures are unremarkable.  Lung apices are clear.  IMPRESSION: Negative exam.   Original Report Authenticated By: Holley Dexter, M.D.    Ct Abdomen Pelvis W Contrast  02/09/2013  *RADIOLOGY REPORT*  Clinical Data:  Crush injury while trimming trees.  CT CHEST, ABDOMEN AND PELVIS WITH CONTRAST  Technique:  Multidetector CT imaging of the chest, abdomen and pelvis was performed following the standard protocol during bolus administration of intravenous contrast.  Contrast: OMNIPAQUE IOHEXOL 300 MG/ML  SOLN the  Comparison:   None.  CT CHEST  Findings:  No pneumothorax.  No mediastinal hematoma.  No pleural or pericardial effusion.  Mild dependent atelectasis posteriorly in both lower lobes.  Lungs otherwise clear.  Normal vascular enhancement.  Thoracic spine and sternum intact.  IMPRESSION:  1.  No acute thoracic process  CT ABDOMEN AND PELVIS  Findings:  There is high attenuation   perihepatic fluid as well as fluid in the pericolic gutters left greater than right and in the cul-de-sac.  There is active extravasation in the anterior peritoneal cavity probably from proximal SMA branch on image 88/2.  Delayed scans show progressive contrast extravasation.  The stomach is physiologically distended.  Small bowel is nondilated.  There is no definite bowel wall thickening, assessment limited without any oral contrast.  The colon is decompressed. Urinary bladder incompletely distended.  There is no free air identified.  No adenopathy.  Lumbar spine intact.  Bony pelvis intact.  Unremarkable liver, spleen, kidneys, adrenal glands, pancreas. Portal vein is patent.  IMPRESSION:  1.  Active extravasation from a branch of the SMA in the right mid abdomen, with hemorrhagic ascites. I discussed the critical test results over the telephone with Dr. Lindie Spruce at the time of interpretation.  Original Report Authenticated By: D. Andria Rhein, MD    Ct Ankle Right Wo Contrast  02/10/2013  *RADIOLOGY REPORT*  Clinical Data: Ankle fracture status post external fixation.  CT OF THE RIGHT ANKLE WITH CONTRAST  Technique:  Multidetector CT imaging was performed following the standard protocol during bolus administration of intravenous contrast.  Comparison: Radiographs 02/09/2013.  Findings: The ankle and lower leg are splinted and in external fixation. External fixator extends through the calcaneal tuberosity.  There is improved alignment of the extensively comminuted intra-articular fracture of the distal tibia.  There is up to 3 mm of depression of the articular surface of the central tibial plafond.  The fracture extends through the posterior aspect of the medial malleolus and results in 11 mm of anterior displacement of the medial malleolus.  Multiple fracture fragments are present within the joint, including the posterior recess.  There is an oblique mildly comminuted fracture of the distal fibula just proximal to the ankle mortise.  This is associated with 6 mm of anterior displacement.  The distal tibiofibular joint appears intact.  The talar dome and subtalar joint appear intact. The additional visualized tarsal bones are  intact.  Soft tissue windows demonstrate potential entrapment of the tibialis posterior and flexor digitorum longus tendons within the tibial fracture just posterior to the medial malleolus.  The peroneal tendons are also in close proximity to the distal fibular fracture, but demonstrate no entrapment.  IMPRESSION:  1.  Improved alignment of extensively comminuted intra-articular fracture of the distal tibia status post external fixation.  There is 3 mm of depression of the articular surface centrally. 2.  There are multiple intra-articular fracture fragments.  There is possible entrapment of the medial flexor tendons. 3.  Mild displacement of distal fibular fracture.   Original Report Authenticated By: Carey Bullocks, M.D.    Dg Pelvis Portable  02/09/2013  *RADIOLOGY REPORT*  Clinical Data: Blunt trauma, level I trauma  PORTABLE PELVIS  Comparison: None.  Findings: Hips are located.  No evidence of pelvic fracture or sacral fracture.  IMPRESSION: No evidence of pelvic fracture.   Original Report Authenticated By: Genevive Bi, M.D.    Dg Chest Portable 1 View  02/09/2013  *RADIOLOGY REPORT*  Clinical Data: :  Trauma, blunt trauma  PORTABLE CHEST - 1 VIEW  Comparison: None.  Findings: The cardiac silhouette is normal.  No evidence of pulmonary contusion or pleural fluid.  No pneumothorax.  No evidence of rib fracture or clavicle fracture.  IMPRESSION: No radiographic evidence of thoracic trauma.   Original Report Authenticated By: Genevive Bi, M.D.    Dg Cerv Spine Flex&ext Only  02/10/2013  *RADIOLOGY REPORT*  Clinical Data: Trauma.  CERVICAL SPINE - FLEXION AND EXTENSION VIEWS ONLY  Comparison: CT 02/09/2013  Findings: Normal alignment on the lateral projection.  No instability with limited flexion or extension.  Disc spaces are maintained.  No visible fracture.  IMPRESSION: No instability with limited flexion or extension.   Original Report Authenticated By: Charlett Nose, M.D.    Dg Ankle Right  Port  02/09/2013  *RADIOLOGY REPORT*  Clinical Data: Level I trauma.  Ankle deformity  PORTABLE RIGHT ANKLE - 2 VIEW  Comparison: None.  Findings: There are two portable views of the right ankle provided. There is a severely comminuted fracture of the distal tibial metaphysis which extends to the articular surface.  There is a fracture of the distal fibula.  On the AP view there appears to be malalignment of the calcaneus with  the talus.  Suspicion for dislocation.  IMPRESSION:  Severely comminuted intra-articular fracture of the distal tibia.  Fracture of the distal fibula.  Concern for dislocation.  Limited views.   Original Report Authenticated By: Genevive Bi, M.D.     ROS:  No recent f/c/n/v/ wt loss PE:  Blood pressure 137/81, pulse 111, temperature 99.2 F (37.3 C), temperature source Oral, resp. rate 21, height 6\' 1"  (1.854 m), weight 94.1 kg (207 lb 7.3 oz), SpO2 93.00%. wn wd male in nad.  Alert and oriented.  EOMI.  Respirations unlabored.  NG tube in place.  R LE with delta frame external fixator in place.  Posterior splint holding ankle in neutral DF.  Toes with brisk cap refill.  5/5 strength in PF and DF of the toes.  Feels LT dorsally and plantarly at the forefoot.  No lymphadenopathy noted a the R LE.  SKin intact and slightly swollen at the foot.  Assessment/Plan: Comminuted / displaced R tibial pilon and fibula fractures s/p provisionsal closed reduction and external fixation.  I explained the nature of the injury to the patient in detail.  At this point he'll need a return to the OR in a few days to a week in order to definitively treat the fibula and tibial plateau fractures.  He should keep the foot elevated as much as possible to decrease the swelling.  He is NWB on the R LE.  I'll follow him to assess soft tissue condition.  [name] 02/10/2013, 10:12 PM

## 2013-02-11 DIAGNOSIS — D62 Acute posthemorrhagic anemia: Secondary | ICD-10-CM

## 2013-02-11 LAB — CBC
HCT: 31.7 % — ABNORMAL LOW (ref 39.0–52.0)
Hemoglobin: 11.1 g/dL — ABNORMAL LOW (ref 13.0–17.0)
MCH: 30.4 pg (ref 26.0–34.0)
MCHC: 35 g/dL (ref 30.0–36.0)
MCV: 86.8 fL (ref 78.0–100.0)
Platelets: 137 10*3/uL — ABNORMAL LOW (ref 150–400)
RBC: 3.65 MIL/uL — ABNORMAL LOW (ref 4.22–5.81)
RDW: 12.6 % (ref 11.5–15.5)
WBC: 11.6 10*3/uL — ABNORMAL HIGH (ref 4.0–10.5)

## 2013-02-11 LAB — BASIC METABOLIC PANEL WITH GFR
BUN: 5 mg/dL — ABNORMAL LOW (ref 6–23)
Calcium: 8.4 mg/dL (ref 8.4–10.5)
GFR calc Af Amer: >90 mL/min (ref >90)
GFR calc non Af Amer: >90 mL/min (ref >90)
Glucose, Bld: 129 mg/dL — ABNORMAL HIGH (ref 70–99)
Potassium: 3.8 meq/L (ref 3.5–5.1)

## 2013-02-11 LAB — BASIC METABOLIC PANEL
CO2: 28 mEq/L (ref 19–32)
Chloride: 97 mEq/L (ref 96–112)
Creatinine, Ser: 0.78 mg/dL (ref 0.50–1.35)
Sodium: 132 mEq/L — ABNORMAL LOW (ref 135–145)

## 2013-02-11 NOTE — Progress Notes (Signed)
Patient ID: [name], male   DOB: 11/26/1976, 37 y.o.   MRN: [id number]   LOS: 2 days  POD#2  Subjective: Pain controlled, denies N/V/flatus.    Objective: Vital signs in last 24 hours: Temp:  [97.8 F (36.6 C)-99.2 F (37.3 C)] 98 F (36.7 C) (03/07 0602) Pulse Rate:  [103-111] 104 (03/07 0602) Resp:  [15-24] 20 (03/07 0602) BP: (117-137)/(48-85) 117/48 mmHg (03/07 0602) SpO2:  [91 %-97 %] 95 % (03/07 0602) Arterial Line BP: (121)/(74) 121/74 mmHg (03/06 0900) Last BM Date: 02/06/13   NGT: 160ml/24h   Laboratory  CBC  Recent Labs  02/10/13 0300 02/11/13 0425  WBC 13.0* 11.6*  HGB 13.2 11.1*  HCT 36.3* 31.7*  PLT 161 137*   BMET  Recent Labs  02/10/13 0300 02/11/13 0425  NA 134* 132*  K 4.2 3.8  CL 102 97  CO2 25 28  GLUCOSE 157* 129*  BUN 10 5*  CREATININE 0.85 0.78  CALCIUM 8.1* 8.4     Physical Exam General appearance: alert and no distress Resp: clear to auscultation bilaterally Cardio: regular rate and rhythm GI: Soft, absent BS, no distension, incision C/D/I, abrasion clean Extremities: RLE: moves toes, warm   Assessment/Plan: Blunt abdominal trauma Mesenteric injury s/p ex lap, SBR Ileus -- Expected, d/c NGT Right ankle fx s/p ex fix -- Dr. Victorino Dike to return to OR in ~3-7 days depending on soft tissue ABL anemia -- Mild, will follow FEN -- Continue NPO VTE -- SCD's, hold Lovenox with drop in Hb/plts Dispo -- PT/OT, ileus   Freeman Caldron, PA-C Pager: [phone] General Trauma PA Pager: [phone]   02/11/2013

## 2013-02-11 NOTE — Clinical Social Work Note (Signed)
Clinical Social Worker continuing to follow patient and family for support and discharge planning needs.  CSW spoke with patient at bedside who states he plans to return home with support of family and home health if needed.  CSW spoke with patient briefly regarding substance use.  Patient states that he hardly ever drinks and when he does it is just socially with friends.  Patient is not concerned with current use and understands risks involved with drinking alcohol while on medications.  SBIRT complete.  No resources needed at this time.  Clinical Social Worker will sign off for now as social work intervention is no longer needed. Please consult [place] again if new need arises.  Macario Golds, [place] [phone]

## 2013-02-11 NOTE — Progress Notes (Signed)
Patient ID: [name], male   DOB: February 21, 1976, 37 y.o.   MRN: [id number]     Subjective:  Patient reports pain as mild to moderate.  Patient states that Dr Victorino Dike did evaluate the patient yesterday.  Objective:   VITALS:   Filed Vitals:   02/11/13 0400 02/11/13 0602 02/11/13 1006 02/11/13 1317  BP:  117/48 135/77 142/81  Pulse:  104 101 98  Temp:  98 F (36.7 C) 98.5 F (36.9 C) 98.7 F (37.1 C)  TempSrc:  Oral    Resp: 20 20 18 20   Height:      Weight:      SpO2: 92% 95% 94% 97%    ABD soft Sensation intact distally Dorsiflexion/Plantar flexion intact Incision: dressing C/D/I and no drainage   Lab Results  Component Value Date   WBC 11.6* 02/11/2013   HGB 11.1* 02/11/2013   HCT 31.7* 02/11/2013   MCV 86.8 02/11/2013   PLT 137* 02/11/2013     Assessment/Plan: 2 Days Post-Op   Active Problems:   Blunt abdominal trauma   Injury of mesentery   Acute blood loss anemia   Closed right pilon fracture   Continue plan per trauma Dr Victorino Dike will be taking over care of his Pilon fracture NWB right lower ext    [name], [name] 02/11/2013, 2:39 PM   Teryl Lucy, MD Cell [phone] Pager [phone]

## 2013-02-11 NOTE — Progress Notes (Signed)
Subjective: NG tube out.  R LE pain well controlled.  Objective: Vital signs in last 24 hours: Temp:  [98 F (36.7 C)-99.2 F (37.3 C)] 98.5 F (36.9 C) (03/07 1006) Pulse Rate:  [101-111] 101 (03/07 1006) Resp:  [15-24] 18 (03/07 1006) BP: (117-137)/(48-85) 135/77 mmHg (03/07 1006) SpO2:  [91 %-97 %] 94 % (03/07 1006)  Intake/Output from previous day: 03/06 0701 - 03/07 0700 In: 3554.9 [I.V.:3504.9; IV Piggyback:50] Out: 1600 [Urine:1420; Emesis/NG output:180] Intake/Output this shift:     Recent Labs  02/09/13 1639 02/09/13 1652 02/09/13 1823 02/10/13 0300 02/11/13 0425  HGB 15.6 16.0 10.5* 13.2 11.1*    Recent Labs  02/10/13 0300 02/11/13 0425  WBC 13.0* 11.6*  RBC 4.32 3.65*  HCT 36.3* 31.7*  PLT 161 137*    Recent Labs  02/10/13 0300 02/11/13 0425  NA 134* 132*  K 4.2 3.8  CL 102 97  CO2 25 28  BUN 10 5*  CREATININE 0.85 0.78  GLUCOSE 157* 129*  CALCIUM 8.1* 8.4    Recent Labs  02/09/13 1639  INR 1.09    R LE with ex fix in place.  Brisk cap refill at toes.  Feels LT nromally.  active PF adn DF of the toes 4/5 strength.    Assessment/Plan: R tibial pilon fracture - I'll plan to check the skin Monday in hopes of surgery Thursday.  Continue NWB on R LE.   HEWITT, JOHN 02/11/2013, 12:47 PM

## 2013-02-11 NOTE — Progress Notes (Signed)
PT Cancellation Note  Patient Details Name: [name] MRN: [id number] DOB: [birth year]   Cancelled Treatment:    Reason Eval/Treat Not Completed: Fatigue/lethargy limiting ability to participate;Pain limiting ability to participate. Pt had just gotten to chair with OT. Will f/u as able.    Donnamarie Poag [place], [place] [phone] 02/11/2013, 10:45 AM

## 2013-02-11 NOTE — Progress Notes (Signed)
PT Cancellation Note  Patient Details Name: [name] MRN: [id number] DOB: 1976-08-19   Cancelled Treatment:    Reason Eval/Treat Not Completed: Pain limiting ability to participate (pt had just gotten back to bed with nursing. ) will f/u tomorrow.    Donnamarie Poag [name], [place] [phone] 02/11/2013, 2:15 PM

## 2013-02-11 NOTE — Progress Notes (Signed)
Doing okay.  Knocked out from OT session.  Will have PT this afternoon.  Probably can start taking clear liquids tomorrow.  This patient has been seen and I agree with the findings and treatment plan.  Marta Lamas. Gae Bon, MD, FACS [phone] (pager) [phone] (direct pager) Trauma Surgeon

## 2013-02-12 LAB — CBC
HCT: 29.7 % — ABNORMAL LOW (ref 39.0–52.0)
HCT: 31.4 % — ABNORMAL LOW (ref 39.0–52.0)
Hemoglobin: 10.3 g/dL — ABNORMAL LOW (ref 13.0–17.0)
Hemoglobin: 10.9 g/dL — ABNORMAL LOW (ref 13.0–17.0)
MCH: 30 pg (ref 26.0–34.0)
MCH: 29.9 pg (ref 26.0–34.0)
MCHC: 34.7 g/dL (ref 30.0–36.0)
MCHC: 34.7 g/dL (ref 30.0–36.0)
MCV: 86.6 fL (ref 78.0–100.0)
MCV: 86 fL (ref 78.0–100.0)
Platelets: 151 10*3/uL (ref 150–400)
Platelets: 155 10*3/uL (ref 150–400)
RBC: 3.43 MIL/uL — ABNORMAL LOW (ref 4.22–5.81)
RBC: 3.65 MIL/uL — ABNORMAL LOW (ref 4.22–5.81)
RDW: 12.5 % (ref 11.5–15.5)
RDW: 12.5 % (ref 11.5–15.5)
WBC: 8.5 10*3/uL (ref 4.0–10.5)
WBC: 7.1 10*3/uL (ref 4.0–10.5)

## 2013-02-12 MED ORDER — PANTOPRAZOLE SODIUM 40 MG IV SOLR
40.0000 mg | Freq: Two times a day (BID) | INTRAVENOUS | Status: DC
  Administered 2013-02-12 – 2013-02-18 (×13): 40 mg via INTRAVENOUS
  Filled 2013-02-12 (×16): qty 40

## 2013-02-12 NOTE — Progress Notes (Signed)
OT Cancellation Note  Patient Details Name: [name] MRN: [id number] DOB: 22-Aug-1976   Cancelled Treatment:    Reason Eval/Treat Not Completed: Fatigue/lethargy limiting ability to participate (n/v and NG tube placement today).   02/12/2013 Cipriano Mile OTR/L Pager [phone] Office [phone]

## 2013-02-12 NOTE — Progress Notes (Signed)
PT Cancellation Note  Patient Details Name: [name] MRN: [id number] DOB: [birth year]   Cancelled Treatment:    Reason Eval/Treat Not Completed: Medical issues which prohibited therapy;Fatigue/lethargy limiting ability to participate.  Patient reports he is having a "rough day" with vomiting and placement of NG tube.  Patient declined PT today.  Will return in am.   Vena Austria 02/12/2013, 1:33 PM

## 2013-02-12 NOTE — Progress Notes (Signed)
Occupational Therapy Evaluation  02/11/13 1000  OT Visit Information  Last OT Received On 02/11/13  Assistance Needed +1  OT Time Calculation  OT Start Time 0910  OT Stop Time 0959  OT Time Calculation (min) 49 min  Precautions  Precautions Other (comment) (rib fxs; Ext fix R ankle)  Restrictions  RLE Weight Bearing NWB  Home Living  Lives With Spouse  Available Help at Discharge Family;Available 24 hours/day  Type of Home House  Home Access Stairs to enter  Entrance Stairs-Number of Steps 2  Entrance Stairs-Rails None  Home Layout Two level;Able to live on main level with bedroom/bathroom  Alternate Level Stairs-Number of Steps flght  Bathroom Shower/Tub Programmer, multimedia (not sure)  Home Adaptive Equipment None  Prior Function  Level of Independence Independent  Able to Take Stairs? Yes  Driving Yes  ADL  Grooming Set up  Upper Body Bathing Set up  Lower Body Bathing Moderate assistance  Where Assessed - Lower Body Bathing Lean right and/or left;Supported sitting  Upper Body Dressing Minimal assistance  Where Assessed - Upper Body Dressing Supported sitting  Lower Body Dressing Maximal assistance  Where Assessed - Lower Body Dressing Supported sit to stand  Toilet Transfer Minimal assistance  Toilet Transfer Method Sit to stand  Equipment Used Gait belt;Rolling walker  Transfers/Ambulation Related to ADLs min A  ADL Comments limited by pain  Cognition  Overall Cognitive Status Appears within functional limits for tasks assessed/performed  Right Upper Extremity Assessment  RUE ROM/Strength/Tone [facility] for tasks assessed  Left Upper Extremity Assessment  LUE ROM/Strength/Tone WFL for tasks assessed  Right Lower Extremity Assessment  RLE ROM/Strength/Tone Deficits;Due to pain;Due to precautions  Left Lower Extremity Assessment  LLE ROM/Strength/Tone WFL for tasks assessed  Bed Mobility  Bed Mobility Supine to Sit   Supine to Sit 4: Min assist  Details for Bed Mobility Assistance a to support RLE  Transfers  Transfers Sit to Stand;Stand to Sit  Sit to Stand 4: Min assist  Stand to Sit 4: Min assist;With upper extremity assist;To chair/3-in-1  Details for Transfer Assistance vc for technique  Other Exercises  Other Exercises Ankle pumps on LLE  OT - End of Session  Equipment Utilized During Treatment Gait belt  Activity Tolerance Patient tolerated treatment well  Patient left in chair;with call bell/phone within reach;with nursing in room  Nurse Communication Mobility status;Precautions;Weight bearing status  OT Assessment  Clinical Impression Statement 37 yo s/p injury from tree limb pinning him against trunk of tree whil at work. Suffered blunt trauma to abdomen, rib fxs and R ankle fx. To OR for abdominal repair and ex fix R ankle. NWB RLE. PT did well with transfer to chair today - maintianing WBS using RW. Will need RW and 3 in 1 for D/C home. States wife will be able to assist as needed after D/C. Will benefit from OT in acute setting to max indiependence with ADL and funcitonal transfers to facilitate D/C home with wife.   OT Recommendation/Assessment Patient needs continued OT Services  OT Problem List Decreased strength;Decreased activity tolerance;Decreased knowledge of use of DME or AE;Decreased knowledge of precautions;Cardiopulmonary status limiting activity;Pain;Increased edema  Barriers to Discharge None  OT Therapy Diagnosis  Generalized weakness;Acute pain  OT Plan  OT Frequency Min 2X/week  OT Treatment/Interventions Self-care/ADL training;DME and/or AE instruction;Therapeutic activities;Patient/family education  OT Recommendation  Follow Up Recommendations No OT follow up  OT Equipment 3 in 1 bedside comode  Individuals Consulted  Consulted and Agree with Results and Recommendations Patient  Acute Rehab OT Goals  OT Goal Formulation With patient  Time For Goal Achievement  02/25/13  Potential to Achieve Goals Good  ADL Goals  Pt Will Transfer to Toilet with supervision;with DME;3-in-1;Maintaining weight bearing status  Pt Will Perform Toileting - Clothing Manipulation with supervision;Standing;Sitting on 3-in-1 or toilet  Pt Will Perform Toileting - Hygiene with modified independence;Sit to stand from 3-in-1/toilet  Additional ADL Goal #1 Pt/wife will verbalize compensatory techniques with/without AE for LB ADL.  ADL Goal: Additional Goal #1 - Progress Goal set today  ADL Goal: Toilet Transfer - Progress Goal set today  ADL Goal: Toileting - Clothing Manipulation - Progress Goal set today  ADL Goal: Toileting - Hygiene - Progress Goal set today  OT General Charges  $OT Visit 1 Procedure  OT Evaluation  $Initial OT Evaluation Tier I 1 Procedure  OT Treatments  $Self Care/Home Management  23-37 mins  $Therapeutic Activity 8-22 mins   * Note entered by Smitty Pluck OT for [facility] OT, eval on 3/7  02/12/2013 Cipriano Mile OTR/L Pager [phone] Office [phone]

## 2013-02-12 NOTE — Progress Notes (Signed)
Patient ID: [name], male   DOB: July 06, 1976, 37 y.o.   MRN: [id number]   LOS: 3 days   Subjective: Pt had significant n/v overnight, got NGT replaced.  +1100 bloody output.     Objective: Vital signs in last 24 hours: Temp:  [98.4 F (36.9 C)-100.6 F (38.1 C)] 99.1 F (37.3 C) (03/08 1000) Pulse Rate:  [86-97] 86 (03/08 1000) Resp:  [15-20] 20 (03/08 1000) BP: (128-150)/(67-84) 130/67 mmHg (03/08 1000) SpO2:  [93 %-98 %] 93 % (03/08 1000) FiO2 (%):  [98 %] 98 % (03/07 1739) Last BM Date: 02/06/13     Laboratory  CBC  Recent Labs  02/11/13 0425 02/12/13 0620  WBC 11.6* 8.5  HGB 11.1* 10.3*  HCT 31.7* 29.7*  PLT 137* 151   BMET  Recent Labs  02/10/13 0300 02/11/13 0425  NA 134* 132*  K 4.2 3.8  CL 102 97  CO2 25 28  GLUCOSE 157* 129*  BUN 10 5*  CREATININE 0.85 0.78  CALCIUM 8.1* 8.4     Physical Exam General appearance: alert and no distress Resp: clear to auscultation bilaterally Cardio: regular rate and rhythm GI: Soft, sl tender no distension, incision C/D/I, abrasion clean Extremities: RLE: moves toes, warm   Assessment/Plan: Blunt abdominal trauma Mesenteric injury s/p ex lap, SBR Ileus -- Unclear cause of GI bleed.  Recheck cbc today.   Right ankle fx s/p ex fix -- Dr. Victorino Dike to return to OR in ~3-7 days depending on soft tissue ABL anemia -- sl worse. FEN -- Continue NPO VTE -- SCD's, hold Lovenox with drop in Hb/plts Dispo -- PT/OT, ileus   02/12/2013

## 2013-02-12 NOTE — Progress Notes (Signed)
N/G output dark and 1050 for today, had 1100 out after insertion during the night, spoke with jennings PA to inform him and order received.

## 2013-02-12 NOTE — Progress Notes (Signed)
0045 Patient vomited about in basin and vomited on the bed. This is second episode of vomiting patient vomited moderate amount of brown liquid in the bed at 2305. 0050 Dr. Magnus Ivan notified of patient vomiting order obtain for a nasal gastric tube. 0110 # 16 french nasal gastric tube inserted. Ngt drain 350 ml of brown liquid. Will continue to monitor.

## 2013-02-13 ENCOUNTER — Inpatient Hospital Stay (HOSPITAL_COMMUNITY): Payer: MEDICAID

## 2013-02-13 LAB — CBC
HCT: 29.2 % — ABNORMAL LOW (ref 39.0–52.0)
Hemoglobin: 10.3 g/dL — ABNORMAL LOW (ref 13.0–17.0)
MCH: 30.7 pg (ref 26.0–34.0)
MCHC: 35.3 g/dL (ref 30.0–36.0)
MCV: 87.2 fL (ref 78.0–100.0)
Platelets: 181 10*3/uL (ref 150–400)
RBC: 3.35 MIL/uL — ABNORMAL LOW (ref 4.22–5.81)
RDW: 12.6 % (ref 11.5–15.5)
WBC: 8.2 10*3/uL (ref 4.0–10.5)

## 2013-02-13 LAB — TYPE AND SCREEN
ABO/RH(D): A POS
Antibody Screen: NEGATIVE
Unit division: 0
Unit division: 0
Unit division: 0
Unit division: 0
Unit division: 0
Unit division: 0
Unit division: 0
Unit division: 0
Unit division: 0
Unit division: 0

## 2013-02-13 MED ORDER — DEXTROSE 5 % IV SOLN
2.0000 g | INTRAVENOUS | Status: DC
  Administered 2013-02-13 – 2013-02-19 (×6): 2 g via INTRAVENOUS
  Filled 2013-02-13 (×7): qty 2

## 2013-02-13 MED ORDER — DEXTROSE 5 % IV SOLN
1.0000 g | Freq: Two times a day (BID) | INTRAVENOUS | Status: DC
  Filled 2013-02-13 (×2): qty 10

## 2013-02-13 NOTE — Progress Notes (Signed)
4 Days Post-Op  Subjective: Green colored sputum, still allot of drainage from Ng, pain comes and goes, stable with PCA, he was not OOB yesterday, No flatus   Objective: Vital signs in last 24 hours: Temp:  [98.7 F (37.1 C)-99.7 F (37.6 C)] 99.1 F (37.3 C) (03/09 0958) Pulse Rate:  [74-84] 74 (03/09 0958) Resp:  [15-20] 18 (03/09 0958) BP: (121-131)/(67-80) 131/73 mmHg (03/09 0958) SpO2:  [94 %-100 %] 97 % (03/09 0958) FiO2 (%):  [94 %-97 %] 97 % (03/09 0458) Last BM Date: 02/06/13 Ice chips, NG 2050 ml yesterday, tm 99.7,  H/h is stable Intake/Output from previous day: 03/08 0701 - 03/09 0700 In: 1650 [I.V.:1650] Out: 3350 [Urine:1300; Emesis/NG output:2050] Intake/Output this shift: Total I/O In: -  Out: 275 [Urine:275]  General appearance: alert, cooperative and no distress Resp: clear to auscultation bilaterally GI: soft, sore, no bowel sounds, NG drainage is like yesterday.  no flatus Extremities: s/p external fixation of RLE.  Lab Results:   Recent Labs  02/12/13 1514 02/13/13 0610  WBC 7.1 8.2  HGB 10.9* 10.3*  HCT 31.4* 29.2*  PLT 155 181    BMET  Recent Labs  02/11/13 0425  NA 132*  K 3.8  CL 97  CO2 28  GLUCOSE 129*  BUN 5*  CREATININE 0.78  CALCIUM 8.4   PT/INR No results found for this basename: LABPROT, INR,  in the last 72 hours   Recent Labs Lab 02/09/13 1639  AST 30  ALT 25  ALKPHOS 45  BILITOT 0.4  PROT 7.7  ALBUMIN 4.5     Lipase  No results found for this basename: lipase     Studies/Results: No results found.  Medications: . bacitracin   Topical BID  . HYDROmorphone PCA 0.3 mg/mL   Intravenous Q4H  . pantoprazole (PROTONIX) IV  40 mg Intravenous Q12H    Assessment/Plan Blunt abdominal trauma  Mesenteric injury s/p ex lap, SBR  Ileus -- Unclear cause of GI bleed. Recheck cbc today.  Right ankle fx s/p ex fix -- Dr. Victorino Dike to return to OR in ~3-7 days depending on soft tissue  ABL anemia -- sl worse.  FEN  -- Continue NPO  VTE -- SCD's, hold Lovenox with drop in Hb/plts  Dispo -- PT/OT, ileus  Plan:  I have encouraged him to use his IS q1h, He has OT and PT ordered, continue NG.  Add rocephin and culture sputum. Get a CXR today. He is not on lovenox or heparin, I will discuss starting one. Recheck labs in AM.  LOS: 4 days    [name] 02/13/2013

## 2013-02-13 NOTE — Progress Notes (Signed)
Physical Therapy Treatment Patient Details Name: [name] MRN: [id number] DOB: March 25, 1976 Today's Date: 02/13/2013 Time: 1023-1050 PT Time Calculation (min): 27 min  PT Assessment / Plan / Recommendation Comments on Treatment Session  Pt pleasant & willing to participate in therapy today.   Eager to get OOB.   Pt returned back to bed at end of session per RN request due to leaving floor soon for procedure/test.   Encouraged pt to be OOB>chair when he returns-- pt agreeable.      Follow Up Recommendations  Home health PT     Does the patient have the potential to tolerate intense rehabilitation     Barriers to Discharge        Equipment Recommendations  Rolling walker with 5" wheels (may progress to crutches)    Recommendations for Other Services    Frequency Min 5X/week   Plan Discharge plan remains appropriate    Precautions / Restrictions Precautions Required Braces or Orthoses: Cervical Brace Cervical Brace: Hard collar Restrictions Weight Bearing Restrictions: Yes RLE Weight Bearing: Non weight bearing       Mobility  Bed Mobility Bed Mobility: Supine to Sit;Sitting - Scoot to Edge of Bed;Sit to Supine Supine to Sit: 5: Supervision;HOB flat;With rails Sitting - Scoot to Edge of Bed: 5: Supervision Sit to Supine: 5: Supervision;HOB flat Details for Bed Mobility Assistance: No physical (A) needed.  Pt using rails to (A) with pulling himself to longsitting.   Transfers Transfers: Sit to Stand;Stand to [employer] Transfers Sit to Stand: 4: Min assist;With upper extremity assist;With armrests;From bed;From chair/3-in-1 Stand to Sit: 4: Min assist;With upper extremity assist;With armrests;To bed;To chair/3-in-1 Stand Pivot Transfers: 4: Min assist Details for Transfer Assistance: Cues for technique.  (A) for balance & safety Ambulation/Gait Ambulation/Gait Assistance: 4: Min assist Ambulation Distance (Feet):  (4 hops forwards) Assistive device: Rolling  walker Ambulation/Gait Assistance Details: (A) for balance & safety.  Pt did well maintaining NWBing RLE.   Gait Pattern: Step-to pattern Stairs: No Wheelchair Mobility Wheelchair Mobility: No      PT Goals Acute Rehab PT Goals Time For Goal Achievement: 02/17/13 Potential to Achieve Goals: Good Pt will go Supine/Side to Sit: with min assist PT Goal: Supine/Side to Sit - Progress: Met Pt will go Sit to Stand: with supervision PT Goal: Sit to Stand - Progress: Progressing toward goal Pt will Ambulate: 51 - 150 feet;with supervision;with least restrictive assistive device PT Goal: Ambulate - Progress: Progressing toward goal Pt will Go Up / Down Stairs: 1-2 stairs;with min assist;with crutches  Visit Information  Last PT Received On: 02/13/13 Assistance Needed: +1    Subjective Data      Cognition  Cognition Overall Cognitive Status: Appears within functional limits for tasks assessed/performed Arousal/Alertness: Awake/alert Orientation Level: Appears intact for tasks assessed Behavior During Session: [facility] for tasks performed    Balance     End of Session PT - End of Session Activity Tolerance: Patient tolerated treatment well Patient left: in bed;with call bell/phone within reach Nurse Communication: Mobility status     Verdell Face, [place] [phone] 02/13/2013

## 2013-02-13 NOTE — Progress Notes (Signed)
Occupational Therapy Treatment Patient Details Name: [name] MRN: [id number] DOB: November 04, 1976 Today's Date: 02/13/2013 Time: 8657-8469 OT Time Calculation (min): 27 min  OT Assessment / Plan / Recommendation Comments on Treatment Session Pt progressing toward goals.    Follow Up Recommendations  No OT follow up    Barriers to Discharge       Equipment Recommendations  3 in 1 bedside comode    Recommendations for Other Services    Frequency Min 2X/week   Plan Discharge plan remains appropriate    Precautions / Restrictions Precautions Precautions:  (rib fx, ext fix R ankle; NG tube) Required Braces or Orthoses: Cervical Brace Cervical Brace: Hard collar Restrictions Weight Bearing Restrictions: Yes RLE Weight Bearing: Non weight bearing   Pertinent Vitals/Pain See vitals    ADL  Toilet Transfer: Simulated;Minimal assistance Toilet Transfer Method: Sit to stand Toilet Transfer Equipment:  (bed to chair) Equipment Used: Rolling walker Transfers/Ambulation Related to ADLs: min assist for bed<>chair transfer, taking several hops during transfer on LLE to maintain RLE NWB status. ADL Comments: RN arrived while pt sitting EOB and reported pt to go to xray.  Pt requesting to have new bed linens before going down for procedure.  Pt performed transfer to chair with min assist and RW and sat in chair ~5 min while clean linens placed on bed. Pt very appreciative to sit in chair although it was briefly. Enouraged pt to get up with nursing staff later today and sit in chair.    OT Diagnosis:    OT Problem List:   OT Treatment Interventions:     OT Goals ADL Goals Pt Will Transfer to Toilet: with supervision;with DME;3-in-1;Maintaining weight bearing status ADL Goal: Toilet Transfer - Progress: Progressing toward goals  Visit Information  Last OT Received On: 02/13/13 Assistance Needed: +1    Subjective Data      Prior Functioning       Cognition   Cognition Overall Cognitive Status: Appears within functional limits for tasks assessed/performed Arousal/Alertness: Awake/alert Orientation Level: Appears intact for tasks assessed Behavior During Session: [facility] for tasks performed    Mobility  Bed Mobility Bed Mobility: Supine to Sit;Sitting - Scoot to Edge of Bed;Sit to Supine Supine to Sit: 5: Supervision;HOB flat;With rails Sitting - Scoot to Edge of Bed: 5: Supervision Sit to Supine: 5: Supervision;HOB flat Details for Bed Mobility Assistance: No physical (A) needed.  Pt using rails to (A) with pulling himself to longsitting.   Transfers Transfers: Sit to Stand;Stand to Sit Sit to Stand: 4: Min assist;With upper extremity assist;With armrests;From bed;From chair/3-in-1 Stand to Sit: 4: Min assist;With upper extremity assist;With armrests;To bed;To chair/3-in-1 Details for Transfer Assistance: Cues for technique.  (A) for balance & safety    Exercises      Balance     End of Session OT - End of Session Activity Tolerance: Patient tolerated treatment well Patient left: in bed;with call bell/phone within reach  GO    02/13/2013 Cipriano Mile OTR/L Pager [phone] Office [phone]  Cipriano Mile 02/13/2013, 1:46 PM

## 2013-02-13 NOTE — Progress Notes (Signed)
I have seen and examined the patient and agree with the assessment and plans.  Will hold on lovenox. Check CXR.  Start antibiotics for possible pneumonia  Douglas A. Magnus Ivan  MD, FACS

## 2013-02-14 LAB — COMPREHENSIVE METABOLIC PANEL
Alkaline Phosphatase: 83 U/L (ref 39–117)
BUN: 10 mg/dL (ref 6–23)
CO2: 28 mEq/L (ref 19–32)
Calcium: 8.8 mg/dL (ref 8.4–10.5)
GFR calc Af Amer: >90 mL/min (ref >90)
GFR calc non Af Amer: >90 mL/min (ref >90)
Glucose, Bld: 100 mg/dL — ABNORMAL HIGH (ref 70–99)
Potassium: 3.6 mEq/L (ref 3.5–5.1)
Total Protein: 6.4 g/dL (ref 6.0–8.3)

## 2013-02-14 LAB — CBC
HCT: 29.9 % — ABNORMAL LOW (ref 39.0–52.0)
Hemoglobin: 10.4 g/dL — ABNORMAL LOW (ref 13.0–17.0)
MCH: 30.3 pg (ref 26.0–34.0)
MCHC: 34.8 g/dL (ref 30.0–36.0)
MCV: 87.2 fL (ref 78.0–100.0)
Platelets: 227 10*3/uL (ref 150–400)
RBC: 3.43 MIL/uL — ABNORMAL LOW (ref 4.22–5.81)
RDW: 12.6 % (ref 11.5–15.5)
WBC: 7.8 10*3/uL (ref 4.0–10.5)

## 2013-02-14 LAB — COMPREHENSIVE METABOLIC PANEL WITH GFR
ALT: 93 U/L — ABNORMAL HIGH (ref 0–53)
AST: 72 U/L — ABNORMAL HIGH (ref 0–37)
Albumin: 2.8 g/dL — ABNORMAL LOW (ref 3.5–5.2)
Chloride: 99 meq/L (ref 96–112)
Creatinine, Ser: 0.66 mg/dL (ref 0.50–1.35)
Sodium: 135 meq/L (ref 135–145)
Total Bilirubin: 0.9 mg/dL (ref 0.3–1.2)

## 2013-02-14 LAB — EXPECTORATED SPUTUM ASSESSMENT W GRAM STAIN, RFLX TO RESP C: Special Requests: NORMAL

## 2013-02-14 MED ORDER — ENOXAPARIN SODIUM 30 MG/0.3ML ~~LOC~~ SOLN
30.0000 mg | Freq: Two times a day (BID) | SUBCUTANEOUS | Status: DC
  Administered 2013-02-14 – 2013-02-15 (×3): 30 mg via SUBCUTANEOUS
  Filled 2013-02-14 (×7): qty 0.3

## 2013-02-14 MED ORDER — METOCLOPRAMIDE HCL 5 MG/ML IJ SOLN
10.0000 mg | Freq: Four times a day (QID) | INTRAMUSCULAR | Status: DC
  Administered 2013-02-14 – 2013-02-17 (×13): 10 mg via INTRAVENOUS
  Filled 2013-02-14 (×18): qty 2

## 2013-02-14 NOTE — Progress Notes (Signed)
Physical Therapy Treatment Patient Details Name: [name] MRN: [id number] DOB: 28-Feb-1976 Today's Date: 02/14/2013 Time: 7829-5621 PT Time Calculation (min): 28 min  PT Assessment / Plan / Recommendation Comments on Treatment Session  Pt con't to progress OOB mobility and ambulation endurance. Pt scheduled for OR tomorrow. PT to re-assess post surgery and con't to work with patient.    Follow Up Recommendations  Home health PT     Does the patient have the potential to tolerate intense rehabilitation     Barriers to Discharge        Equipment Recommendations  Rolling walker with 5" wheels (vs crutches (pending ability to progress))    Recommendations for Other Services    Frequency Min 5X/week   Plan Discharge plan remains appropriate    Precautions / Restrictions Precautions Precautions: Fall Precaution Comments: NG tube, R LE ext fix Restrictions RLE Weight Bearing: Non weight bearing   Pertinent Vitals/Pain 9/10 R heel/foot    Mobility  Bed Mobility Bed Mobility: Supine to Sit;Sitting - Scoot to Edge of Bed;Sit to Supine Supine to Sit: 5: Supervision;HOB flat;With rails Sitting - Scoot to Edge of Bed: 5: Supervision Sit to Supine: 5: Supervision;HOB flat Details for Bed Mobility Assistance: pt with strong use of bilat UEs to pull self up using rails Transfers Transfers: Sit to Stand;Stand to Sit;Stand Pivot Transfers Sit to Stand: 4: Min assist;With upper extremity assist;With armrests;From bed;From chair/3-in-1 Stand to Sit: 4: Min assist;With upper extremity assist;With armrests;To bed;To chair/3-in-1 Stand Pivot Transfers: 4: Min assist Details for Transfer Assistance: Cues for technique.  (A) for balance & safety Ambulation/Gait Ambulation/Gait Assistance: 4: Min assist Ambulation Distance (Feet): 30 Feet Assistive device: Rolling walker Ambulation/Gait Assistance Details: RN tech to assist with O2 tank and IV pole management, pt 100% compliant with  maintaining R LE NWB. pt with good walker management Gait Pattern: Step-to pattern Stairs: No    Exercises     PT Diagnosis:    PT Problem List:   PT Treatment Interventions:     PT Goals Acute Rehab PT Goals Pt will go Supine/Side to Sit: with modified independence;with HOB 0 degrees (with no Rail) PT Goal: Supine/Side to Sit - Progress: Goal set today Pt will go Sit to Stand: with supervision PT Goal: Sit to Stand - Progress: Met PT Goal: Ambulate - Progress: Progressing toward goal  Visit Information  Last PT Received On: 02/14/13 Assistance Needed: +1    Subjective Data  Subjective: Pt received supine in bed agreeable to PT with report "I'm suppose to go to the OR tomorrow."   Cognition  Cognition Overall Cognitive Status: Appears within functional limits for tasks assessed/performed Arousal/Alertness: Awake/alert Orientation Level: Appears intact for tasks assessed Behavior During Session: [facility] for tasks performed    Balance  Balance Balance Assessed: Yes Static Standing Balance Static Standing - Balance Support: Bilateral upper extremity supported Static Standing - Level of Assistance: 5: Stand by assistance Static Standing - Comment/# of Minutes: pt stood x 2 min for hygiene to back side. pt kept R LE NWB  End of Session PT - End of Session Equipment Utilized During Treatment: Oxygen (3 LO2 via [place]) Activity Tolerance: Patient tolerated treatment well Patient left: in chair;with call bell/phone within reach;with nursing in room Nurse Communication: Mobility status   GP     Marcene Brawn 02/14/2013, 11:09 AM  Lewis Shock, PT, DPT Pager #: [phone] Office #: [phone]

## 2013-02-14 NOTE — Progress Notes (Signed)
Some BS and passed a little flatus. Ortho plans noted. Patient examined and I agree with the assessment and plan  Violeta Gelinas, MD, MPH, FACS Pager: [phone]  02/14/2013 12:14 PM

## 2013-02-14 NOTE — Progress Notes (Signed)
Patient ID: [name], male   DOB: 17-Feb-1976, 37 y.o.   MRN: [id number]   LOS: 5 days   Subjective: Denies flatus/N/V. Pain controlled.  Objective: Vital signs in last 24 hours: Temp:  [98.3 F (36.8 C)-99.1 F (37.3 C)] 98.3 F (36.8 C) (03/10 0605) Pulse Rate:  [60-74] 60 (03/10 0605) Resp:  [15-19] 18 (03/10 0605) BP: (122-141)/(65-75) 126/73 mmHg (03/10 0605) SpO2:  [92 %-100 %] 97 % (03/10 0605) Last BM Date: 02/09/13   NGT: 1578ml/24h   Laboratory  CBC  Recent Labs  02/13/13 0610 02/14/13 0700  WBC 8.2 7.8  HGB 10.3* 10.4*  HCT 29.2* 29.9*  PLT 181 227    Physical Exam General appearance: alert and no distress Resp: clear to auscultation bilaterally Cardio: regular rate and rhythm GI: Soft, appropriately TTP, incision C/D/I, diminished BS Extremities: NVI   Assessment/Plan: Blunt abdominal trauma  Mesenteric injury s/p ex lap, SBR  Ileus -- Progressing, suspect flatus to appear in next day or two, will start reglan just in case  Right ankle fx s/p ex fix -- Dr. Victorino Dike to return to OR in ~3-7 days (tentatively Thurs) depending on soft tissue  ABL anemia -- Stable FEN -- Continue NPO, NGT, PCA VTE -- SCD's, can start Lovenox  Dispo -- PT/OT, ileus    Freeman Caldron, PA-C Pager: [phone] General Trauma PA Pager: [phone]   02/14/2013

## 2013-02-15 NOTE — Progress Notes (Signed)
LOS: 6 days   Subjective: Pt c/o pain/swelling in right leg since the the splint was changed out yesterday.  Pt has +flatus, no BM yet, urinating well.  Pt denies N/V, and he is very hungry.  Pt working with PT/OT.  Objective: Vital signs in last 24 hours: Temp:  [98.1 F (36.7 C)-98.8 F (37.1 C)] 98.4 F (36.9 C) (03/11 0510) Pulse Rate:  [67-80] 80 (03/11 0510) Resp:  [11-20] 19 (03/11 0752) BP: (110-136)/(59-78) 110/59 mmHg (03/11 0510) SpO2:  [93 %-100 %] 95 % (03/11 0752) Last BM Date: 02/09/13  Lab Results:  CBC  Recent Labs  02/13/13 0610 02/14/13 0700  WBC 8.2 7.8  HGB 10.3* 10.4*  HCT 29.2* 29.9*  PLT 181 227   BMET  Recent Labs  02/14/13 0700  NA 135  K 3.6  CL 99  CO2 28  GLUCOSE 100*  BUN 10  CREATININE 0.66  CALCIUM 8.8    Imaging: Dg Chest 2 View  02/14/2013  *RADIOLOGY REPORT*  Clinical Data:  Trauma, small bowel mesenteric laceration, post exploratory laparotomy with small bowel resection and mesenteric repair  CHEST - 2 VIEW  Comparison: 02/09/2013  Findings: Nasogastric tube tip projects over stomach, proximal side port projecting over the gastroesophageal junction region. Normal heart size, mediastinal contours, and pulmonary vascularity. Small mild pleural fluid right lung base. Opacity identified at medial right apex, question upper lobe atelectasis. Left lung clear. Free air identified on the right hemi diaphragm, patient post exploratory laparotomy 02/09/2013. No fractures identified.  IMPRESSION: Free intraperitoneal air potentially related to laparotomy on 02/09/2013; recommend attention on follow-up exams to ensure resolution. Question atelectasis right upper lobe.  Critical Value/emergent results were called by telephone at the time of interpretation on 02/14/2013 at 0209 hours to [employer] on [address], who verbally acknowledged these results.   Original Report Authenticated By: Ulyses Southward, M.D.      PE: General appearance: alert,  cooperative and no distress Resp: clear to auscultation bilaterally Cardio: regular rate and rhythm, S1, S2 normal, no murmur, click, rub or gallop GI: soft, mild tenderness, no distension; bowel sounds improved; incisions C/D/CI Extremities: Right leg in ext fix and splint, distal right leg CSM intact, left leg normal; normal UE Pulses: 2+ and symmetric   Assessment/Plan: Blunt abdominal trauma  Mesenteric injury s/p ex lap, SBR  Ileus -- Progressing, +flatus Right ankle fx s/p ex fix -- Dr. Victorino Dike to return to OR in ~ few days (today or Thurs) depending on soft tissue  ABL anemia -- Stable  FEN -- Continue PCA, will clamp NG tube for trials given bowel function return, if he is going to OR later today will continue to keep NPO until after the surgery, but if surgery planned for Thursday then will give a trial of clears before the NG is discontinued, and maybe clear liquid tray at dinner if tolerating VTE -- SCD's,  Lovenox  Dispo -- PT/OT, ileus    Aris Georgia, PA-C Pager: [phone] General Trauma PA Pager: [phone]   02/15/2013

## 2013-02-15 NOTE — Progress Notes (Signed)
I D/W Dr. Victorino Dike - he plans surgery 3/13.  Patient has tolerated NGT clamped over 3h so will D/C and give clears. Patient examined and I agree with the assessment and plan  Violeta Gelinas, MD, MPH, FACS Pager: [phone]  02/15/2013 1:07 PM

## 2013-02-15 NOTE — Progress Notes (Signed)
Subjective: Pt's ileus resolving.  C/o aching pain in the right ankle.  No calf pain.  OOB to chair with PT.  On lovenox.  Objective: Vital signs in last 24 hours: Temp:  [97.8 F (36.6 C)-98.6 F (37 C)] 97.8 F (36.6 C) (03/11 1750) Pulse Rate:  [67-90] 90 (03/11 1750) Resp:  [13-23] 15 (03/11 1905) BP: (110-132)/(59-70) 132/66 mmHg (03/11 1750) SpO2:  [93 %-99 %] 93 % (03/11 1905)  Intake/Output from previous day: 03/10 0701 - 03/11 0700 In: 2385 [I.V.:2335; IV Piggyback:50] Out: 2475 [Urine:1375; Emesis/NG output:1100] Intake/Output this shift:     Recent Labs  02/13/13 0610 02/14/13 0700  HGB 10.3* 10.4*    Recent Labs  02/13/13 0610 02/14/13 0700  WBC 8.2 7.8  RBC 3.35* 3.43*  HCT 29.2* 29.9*  PLT 181 227    Recent Labs  02/14/13 0700  NA 135  K 3.6  CL 99  CO2 28  BUN 10  CREATININE 0.66  GLUCOSE 100*  CALCIUM 8.8   PE:  R ankle with ex fix in place.  Dressings and splint removed.  Skin has resolving swelling and ecchymosis.  Skin wrinkles anteriorly.  2+ dp and PT pulses.  Feels LT dorsally and plantarly at foot.  5/5 strength in pf and df of toes.  No lymphadenopathy.  Pin sites dry.  Assessment/Plan: Right fibula and tibial pilon fracture s/p closed reduction and external fixation - to OR Thursday for ORIF of tibia and fibula and removal of ex fix.  This is a severely comminuted and displaced fracture that has the high risk of post traumatic arthritis.  This was explained to the pt in detail along with the risks and benefits of surgical and nonsurgical treatment.  He understands and would like to proceed with surgery.  D/w Dr. Janee Morn.   [name] 02/15/2013, 8:58 PM

## 2013-02-15 NOTE — Progress Notes (Signed)
Physical Therapy Treatment Patient Details Name: [name] MRN: [id number] DOB: 06/29/1976 Today's Date: 02/15/2013 Time: 4098-1191 PT Time Calculation (min): 27 min  PT Assessment / Plan / Recommendation Comments on Treatment Session  Pt. cont to improve endurance and distance with gait.  Pt. inquiring about using crutches after next surgery.    Follow Up Recommendations  Home health PT     Does the patient have the potential to tolerate intense rehabilitation     Barriers to Discharge        Equipment Recommendations  Rolling walker with 5" wheels    Recommendations for Other Services    Frequency Min 5X/week   Plan Discharge plan remains appropriate;Frequency remains appropriate    Precautions / Restrictions Precautions Precautions: Fall Precaution Comments: NG tube, R LE ext fix Required Braces or Orthoses:  (cervical brace d/c'ed) Restrictions Weight Bearing Restrictions: Yes RLE Weight Bearing: Non weight bearing   Pertinent Vitals/Pain 6/10 LLE, pt. Used PCA x1    Mobility  Bed Mobility Bed Mobility: Not assessed Transfers Transfers: Sit to Stand;Stand to Sit Sit to Stand: 5: Supervision;With armrests;From chair/3-in-1 Stand to Sit: 5: Supervision;To chair/3-in-1;With upper extremity assist Details for Transfer Assistance: pt. demonstrated safe technique Ambulation/Gait Ambulation/Gait Assistance: 5: Supervision Ambulation Distance (Feet): 60 Feet Assistive device: Rolling walker Ambulation/Gait Assistance Details: 3 standing rest breaks needed with chair follow Gait Pattern: Step-to pattern (swing through) Stairs: No Wheelchair Mobility Wheelchair Mobility: No    Exercises     PT Diagnosis:    PT Problem List:   PT Treatment Interventions:     PT Goals Acute Rehab PT Goals PT Goal: Sit to Stand - Progress: Met PT Goal: Ambulate - Progress: Partly met (may be able to progress to crutches)  Visit Information  Last PT Received On:  02/15/13 Assistance Needed: +1    Subjective Data  Subjective: "I thought you forgot about me."   Cognition  Cognition Overall Cognitive Status: Appears within functional limits for tasks assessed/performed Arousal/Alertness: Awake/alert Orientation Level: Appears intact for tasks assessed Behavior During Session: [facility] for tasks performed    Balance  Static Standing Balance Static Standing - Balance Support: Bilateral upper extremity supported Static Standing - Level of Assistance: 6: Modified independent (Device/Increase time)  End of Session PT - End of Session Equipment Utilized During Treatment: Oxygen;Other (comment) (pt. declined gait belt 2/2 bruised ribs, 2L O2) Activity Tolerance: Patient tolerated treatment well Patient left: in chair;with call bell/phone within reach Nurse Communication: Mobility status   GP     Feltis, Nicki Reaper 02/15/2013, 11:54 AM  Nicki Reaper. Feltis, PT, DPT [phone]

## 2013-02-16 LAB — COMPREHENSIVE METABOLIC PANEL WITH GFR
ALT: 116 U/L — ABNORMAL HIGH (ref 0–53)
AST: 55 U/L — ABNORMAL HIGH (ref 0–37)
Alkaline Phosphatase: 87 U/L (ref 39–117)
CO2: 30 meq/L (ref 19–32)
Calcium: 8.8 mg/dL (ref 8.4–10.5)
Chloride: 98 meq/L (ref 96–112)
GFR calc Af Amer: >90 mL/min (ref >90)
GFR calc non Af Amer: >90 mL/min (ref >90)
Glucose, Bld: 93 mg/dL (ref 70–99)
Potassium: 4 meq/L (ref 3.5–5.1)
Sodium: 137 meq/L (ref 135–145)
Total Bilirubin: 0.7 mg/dL (ref 0.3–1.2)

## 2013-02-16 LAB — CBC
HCT: 27.9 % — ABNORMAL LOW (ref 39.0–52.0)
HCT: 28.3 % — ABNORMAL LOW (ref 39.0–52.0)
Hemoglobin: 9.8 g/dL — ABNORMAL LOW (ref 13.0–17.0)
Hemoglobin: 10 g/dL — ABNORMAL LOW (ref 13.0–17.0)
MCH: 29.7 pg (ref 26.0–34.0)
MCH: 30.4 pg (ref 26.0–34.0)
MCHC: 35.1 g/dL (ref 30.0–36.0)
MCHC: 35.3 g/dL (ref 30.0–36.0)
MCV: 84.5 fL (ref 78.0–100.0)
MCV: 86 fL (ref 78.0–100.0)
Platelets: 347 10*3/uL (ref 150–400)
Platelets: 386 10*3/uL (ref 150–400)
RBC: 3.3 MIL/uL — ABNORMAL LOW (ref 4.22–5.81)
RBC: 3.29 MIL/uL — ABNORMAL LOW (ref 4.22–5.81)
RDW: 12.5 % (ref 11.5–15.5)
RDW: 12.3 % (ref 11.5–15.5)
WBC: 7.4 10*3/uL (ref 4.0–10.5)
WBC: 9.3 10*3/uL (ref 4.0–10.5)

## 2013-02-16 LAB — BASIC METABOLIC PANEL WITH GFR
BUN: 7 mg/dL (ref 6–23)
CO2: 29 meq/L (ref 19–32)
Calcium: 8.7 mg/dL (ref 8.4–10.5)
Chloride: 98 meq/L (ref 96–112)
Creatinine, Ser: 0.63 mg/dL (ref 0.50–1.35)
GFR calc Af Amer: >90 mL/min
GFR calc non Af Amer: >90 mL/min
Glucose, Bld: 99 mg/dL (ref 70–99)
Potassium: 3.6 meq/L (ref 3.5–5.1)
Sodium: 135 meq/L (ref 135–145)

## 2013-02-16 LAB — COMPREHENSIVE METABOLIC PANEL
Albumin: 3 g/dL — ABNORMAL LOW (ref 3.5–5.2)
BUN: 7 mg/dL (ref 6–23)
Creatinine, Ser: 0.66 mg/dL (ref 0.50–1.35)
Total Protein: 6.7 g/dL (ref 6.0–8.3)

## 2013-02-16 LAB — SURGICAL PCR SCREEN
MRSA, PCR: NEGATIVE
Staphylococcus aureus: NEGATIVE

## 2013-02-16 MED ORDER — DIPHENHYDRAMINE HCL 25 MG PO CAPS
25.0000 mg | ORAL_CAPSULE | Freq: Every evening | ORAL | Status: DC | PRN
  Administered 2013-02-16 – 2013-02-18 (×4): 25 mg via ORAL
  Filled 2013-02-16 (×9): qty 1

## 2013-02-16 MED ORDER — SODIUM CHLORIDE 0.9 % IV SOLN
INTRAVENOUS | Status: DC
  Administered 2013-02-16 – 2013-02-17 (×3): via INTRAVENOUS

## 2013-02-16 MED ORDER — DOCUSATE SODIUM 100 MG PO CAPS
100.0000 mg | ORAL_CAPSULE | Freq: Two times a day (BID) | ORAL | Status: DC | PRN
  Administered 2013-02-16 – 2013-02-18 (×2): 100 mg via ORAL
  Filled 2013-02-16 (×2): qty 1

## 2013-02-16 MED ORDER — PSYLLIUM 95 % PO PACK
1.0000 | PACK | Freq: Every day | ORAL | Status: DC
  Administered 2013-02-18 – 2013-02-19 (×2): 1 via ORAL
  Filled 2013-02-16 (×5): qty 1

## 2013-02-16 MED ORDER — CHLORHEXIDINE GLUCONATE 4 % EX LIQD
60.0000 mL | Freq: Once | CUTANEOUS | Status: AC
  Administered 2013-02-16: 4 via TOPICAL
  Filled 2013-02-16: qty 60

## 2013-02-16 MED ORDER — CEFAZOLIN SODIUM-DEXTROSE 2-3 GM-% IV SOLR
2.0000 g | INTRAVENOUS | Status: AC
  Administered 2013-02-17: 2 g via INTRAVENOUS
  Filled 2013-02-16: qty 50

## 2013-02-16 NOTE — Progress Notes (Signed)
LOS: 7 days   Subjective: Pt feels pretty good today.  C/o constipation, but passing a lot of gas.  No other complaints.  Tolerating diet well.  Pt ambulating with PT well.  Pt up in chair a lot throughout the day.  Pt ready for surgery tomorrow.    Objective: Vital signs in last 24 hours: Temp:  [97.8 F (36.6 C)-99.7 F (37.6 C)] 98.2 F (36.8 C) (03/12 0607) Pulse Rate:  [77-90] 77 (03/12 0607) Resp:  [14-23] 19 (03/12 0746) BP: (123-132)/(66-74) 126/72 mmHg (03/12 0607) SpO2:  [92 %-99 %] 96 % (03/12 0746) Last BM Date: 02/09/13  Lab Results:  CBC  Recent Labs  02/14/13 0700  WBC 7.8  HGB 10.4*  HCT 29.9*  PLT 227   BMET  Recent Labs  02/14/13 0700  NA 135  K 3.6  CL 99  CO2 28  GLUCOSE 100*  BUN 10  CREATININE 0.66  CALCIUM 8.8    Imaging: No results found.   PE: General appearance: alert, cooperative and no distress  Resp: clear to auscultation bilaterally  Cardio: regular rate and rhythm, S1, S2 normal, no murmur, click, rub or gallop  GI: soft, non-tender, no distension; + bowel sounds; incisions C/D/I Extremities: Right leg in ext fix and splint, distal right leg CSM intact, left leg normal; normal UE  Pulses: 2+ and symmetric  Assessment/Plan: Blunt abdominal trauma  Mesenteric injury s/p ex lap, SBR  Ileus -- Progressing, good flatus, no BM yet, start stool softeners, encourage less pain meds Right ankle fx s/p ex fix -- Dr. Victorino Dike to return to OR on Thurs for ORIF ABL anemia -- Stable  FEN -- Continue PCA until after surgery, tolerating clears, advance to fulls, NPO after midnight VTE -- SCD's, Lovenox - likely needs to be held for surgery will await recommendations from Dr. Elsie Saas -- PT/OT recommending [facility] PT, OR on Thursday for tib/fib ORIF    Aris Georgia, PA-C Pager: [phone] General Trauma PA Pager: [phone]   02/16/2013

## 2013-02-16 NOTE — Progress Notes (Signed)
Physical Therapy Treatment Patient Details Name: [name] MRN: [id number] DOB: 1976/04/03 Today's Date: 02/16/2013 Time: 4098-1191 PT Time Calculation (min): 27 min  PT Assessment / Plan / Recommendation Comments on Treatment Session  Pt con't to improve ambulation tolerance. Pt scheduled for OR tomorrow for ORIF to R ankle. PT to re-assess pt post surgery and will then complete stair negotiation in prep for d/c home. anticipate pt to do very well.    Follow Up Recommendations  Home health PT;Supervision/Assistance - 24 hour     Does the patient have the potential to tolerate intense rehabilitation     Barriers to Discharge        Equipment Recommendations  Rolling walker with 5" wheels    Recommendations for Other Services    Frequency Min 5X/week   Plan Discharge plan remains appropriate;Frequency remains appropriate    Precautions / Restrictions Precautions Precautions: Fall Restrictions RLE Weight Bearing: Non weight bearing   Pertinent Vitals/Pain Pt reports "its not to bad but did press PCA x 2 during session."    Mobility  Bed Mobility Bed Mobility: Not assessed Transfers Transfers: Sit to Stand;Stand to Sit Sit to Stand: 5: Supervision;With armrests;From chair/3-in-1 Stand to Sit: 5: Supervision;To chair/3-in-1;With upper extremity assist Details for Transfer Assistance: v/c's for hand placement Ambulation/Gait Ambulation/Gait Assistance: 4: Min guard Ambulation Distance (Feet): 75 Feet Assistive device: Rolling walker Ambulation/Gait Assistance Details: pt with good maintence of R LE NWB Gait Pattern: Step-to pattern Gait velocity: wfl Stairs: No    Exercises General Exercises - Lower Extremity Quad Sets: AROM;Right;10 reps;Seated Gluteal Sets: AROM;Both;10 reps;Seated Heel Slides: AROM;Right;10 reps;Seated (maintained R LE off bed) Hip ABduction/ADduction: AROM;Right;10 reps;Seated Straight Leg Raises: AROM;Right;10 reps;Seated   PT  Diagnosis:    PT Problem List:   PT Treatment Interventions:     PT Goals Acute Rehab PT Goals Time For Goal Achievement: 02/23/13 Potential to Achieve Goals: Good Pt will go Sit to Stand: with modified independence;with upper extremity assist PT Goal: Sit to Stand - Progress: Goal set today PT Goal: Ambulate - Progress: Partly met Additional Goals Additional Goal #1: Pt independent with HEP. PT Goal: Additional Goal #1 - Progress: Goal set today  Visit Information  Last PT Received On: 02/16/13    Subjective Data  Subjective: Pt received up in chair agreeable to PT.   Cognition  Cognition Overall Cognitive Status: Appears within functional limits for tasks assessed/performed Arousal/Alertness: Awake/alert Orientation Level: Appears intact for tasks assessed Behavior During Session: [facility] for tasks performed    Balance     End of Session PT - End of Session Activity Tolerance: Patient tolerated treatment well Patient left: in chair;with call bell/phone within reach;with family/visitor present Nurse Communication: Mobility status   GP     Marcene Brawn 02/16/2013, 4:18 PM  Lewis Shock, PT, DPT Pager #: [phone] Office #: [phone]

## 2013-02-16 NOTE — Progress Notes (Signed)
Patient looks so much better.  To have surgery on right ankle tomorrow.  Keep PCA for now because of surgery tomorrow.  Advance diet.  This patient has been seen and I agree with the findings and treatment plan.  Marta Lamas. Gae Bon, MD, FACS [phone] (pager) [phone] (direct pager) Trauma Surgeon

## 2013-02-17 ENCOUNTER — Inpatient Hospital Stay (HOSPITAL_COMMUNITY): Payer: MEDICAID | Admitting: Anesthesiology

## 2013-02-17 ENCOUNTER — Encounter (HOSPITAL_COMMUNITY): Payer: Self-pay | Admitting: Certified Registered"

## 2013-02-17 ENCOUNTER — Encounter (HOSPITAL_COMMUNITY): Payer: Self-pay | Admitting: Anesthesiology

## 2013-02-17 ENCOUNTER — Inpatient Hospital Stay (HOSPITAL_COMMUNITY): Payer: MEDICAID

## 2013-02-17 ENCOUNTER — Encounter (HOSPITAL_COMMUNITY): Admission: EM | Disposition: A | Discharge status: Home Health | Payer: Self-pay | Source: Home / Self Care

## 2013-02-17 DIAGNOSIS — J189 Pneumonia, unspecified organism: Secondary | ICD-10-CM | POA: Diagnosis not present

## 2013-02-17 HISTORY — PX: OPEN REDUCTION INTERNAL FIXATION (ORIF) TIBIA/FIBULA FRACTURE: SHX5992

## 2013-02-17 LAB — CREATININE, SERUM
Creatinine, Ser: 0.79 mg/dL (ref 0.50–1.35)
GFR calc Af Amer: >90 mL/min (ref >90)
GFR calc non Af Amer: >90 mL/min (ref >90)

## 2013-02-17 LAB — CBC
HCT: 30.3 % — ABNORMAL LOW (ref 39.0–52.0)
Hemoglobin: 10.5 g/dL — ABNORMAL LOW (ref 13.0–17.0)
MCH: 29.9 pg (ref 26.0–34.0)
MCHC: 34.7 g/dL (ref 30.0–36.0)
MCV: 86.3 fL (ref 78.0–100.0)
Platelets: 483 10*3/uL — ABNORMAL HIGH (ref 150–400)
RBC: 3.51 MIL/uL — ABNORMAL LOW (ref 4.22–5.81)
RDW: 12.5 % (ref 11.5–15.5)
WBC: 11.5 10*3/uL — ABNORMAL HIGH (ref 4.0–10.5)

## 2013-02-17 LAB — CULTURE, RESPIRATORY W GRAM STAIN

## 2013-02-17 SURGERY — OPEN REDUCTION INTERNAL FIXATION (ORIF) TIBIA/FIBULA FRACTURE
Anesthesia: General | Site: Ankle | Laterality: Right | Wound class: Clean

## 2013-02-17 MED ORDER — SODIUM CHLORIDE 0.9 % IV SOLN
INTRAVENOUS | Status: DC
  Administered 2013-02-17 – 2013-02-19 (×5): via INTRAVENOUS

## 2013-02-17 MED ORDER — SODIUM CHLORIDE 0.9 % IJ SOLN: 9.0000 mL | INTRAMUSCULAR | Status: DC | PRN

## 2013-02-17 MED ORDER — FENTANYL CITRATE 0.05 MG/ML IJ SOLN
INTRAMUSCULAR | Status: DC | PRN
  Administered 2013-02-17 (×2): 100 ug via INTRAVENOUS
  Administered 2013-02-17 (×2): 50 ug via INTRAVENOUS
  Administered 2013-02-17: 150 ug via INTRAVENOUS
  Administered 2013-02-17: 100 ug via INTRAVENOUS
  Administered 2013-02-17 (×3): 50 ug via INTRAVENOUS

## 2013-02-17 MED ORDER — HYDROMORPHONE HCL PF 1 MG/ML IJ SOLN
INTRAMUSCULAR | Status: AC
  Administered 2013-02-17: 0.5 mg via INTRAVENOUS
  Filled 2013-02-17: qty 1

## 2013-02-17 MED ORDER — PROPOFOL 10 MG/ML IV BOLUS
INTRAVENOUS | Status: DC | PRN
  Administered 2013-02-17: 200 mg via INTRAVENOUS

## 2013-02-17 MED ORDER — MIDAZOLAM HCL 5 MG/5ML IJ SOLN
INTRAMUSCULAR | Status: DC | PRN
  Administered 2013-02-17: 2 mg via INTRAVENOUS

## 2013-02-17 MED ORDER — DIPHENHYDRAMINE HCL 50 MG/ML IJ SOLN: 12.5000 mg | Freq: Four times a day (QID) | INTRAMUSCULAR | Status: DC | PRN

## 2013-02-17 MED ORDER — ONDANSETRON HCL 4 MG/2ML IJ SOLN: 4.0000 mg | Freq: Four times a day (QID) | INTRAMUSCULAR | Status: DC | PRN

## 2013-02-17 MED ORDER — DIAZEPAM 5 MG/ML IJ SOLN: 5.0000 mg | Freq: Once | INTRAMUSCULAR | Status: AC

## 2013-02-17 MED ORDER — OXYCODONE HCL 5 MG PO TABS
ORAL_TABLET | ORAL | Status: AC
  Administered 2013-02-17: 10 mg via ORAL
  Filled 2013-02-17: qty 2

## 2013-02-17 MED ORDER — HYDROMORPHONE HCL PF 1 MG/ML IJ SOLN
0.5000 mg | INTRAMUSCULAR | Status: DC | PRN
  Administered 2013-02-17: 1 mg via INTRAVENOUS
  Filled 2013-02-17: qty 1

## 2013-02-17 MED ORDER — OXYCODONE HCL 5 MG PO TABS: 5.0000 mg | ORAL_TABLET | Freq: Once | ORAL | Status: DC | PRN

## 2013-02-17 MED ORDER — NALOXONE HCL 0.4 MG/ML IJ SOLN: 0.4000 mg | INTRAMUSCULAR | Status: DC | PRN

## 2013-02-17 MED ORDER — BACITRACIN ZINC 500 UNIT/GM EX OINT
TOPICAL_OINTMENT | CUTANEOUS | Status: AC
  Filled 2013-02-17: qty 15

## 2013-02-17 MED ORDER — DIAZEPAM 5 MG/ML IJ SOLN
INTRAMUSCULAR | Status: AC
  Administered 2013-02-17: 2.5 mg via INTRAVENOUS
  Filled 2013-02-17: qty 2

## 2013-02-17 MED ORDER — ENOXAPARIN SODIUM 40 MG/0.4ML ~~LOC~~ SOLN
40.0000 mg | SUBCUTANEOUS | Status: DC
  Administered 2013-02-18: 40 mg via SUBCUTANEOUS
  Filled 2013-02-17 (×3): qty 0.4

## 2013-02-17 MED ORDER — ROCURONIUM BROMIDE 100 MG/10ML IV SOLN
INTRAVENOUS | Status: DC | PRN
  Administered 2013-02-17: 20 mg via INTRAVENOUS
  Administered 2013-02-17: 50 mg via INTRAVENOUS
  Administered 2013-02-17: 10 mg via INTRAVENOUS
  Administered 2013-02-17: 20 mg via INTRAVENOUS

## 2013-02-17 MED ORDER — OXYCODONE HCL 5 MG/5ML PO SOLN: 5.0000 mg | Freq: Once | ORAL | Status: DC | PRN

## 2013-02-17 MED ORDER — OXYCODONE HCL 5 MG PO TABS
5.0000 mg | ORAL_TABLET | ORAL | Status: DC | PRN
  Administered 2013-02-17: 10 mg via ORAL
  Filled 2013-02-17: qty 2

## 2013-02-17 MED ORDER — LACTATED RINGERS IV SOLN
INTRAVENOUS | Status: DC | PRN
  Administered 2013-02-17 (×3): via INTRAVENOUS

## 2013-02-17 MED ORDER — HYDROMORPHONE HCL PF 1 MG/ML IJ SOLN
0.2500 mg | INTRAMUSCULAR | Status: DC | PRN
  Administered 2013-02-17 (×2): 0.5 mg via INTRAVENOUS

## 2013-02-17 MED ORDER — ACETAMINOPHEN 325 MG PO TABS
650.0000 mg | ORAL_TABLET | Freq: Four times a day (QID) | ORAL | Status: DC | PRN
  Administered 2013-02-19: 650 mg via ORAL
  Filled 2013-02-17: qty 2

## 2013-02-17 MED ORDER — BUPIVACAINE HCL (PF) 0.5 % IJ SOLN
INTRAMUSCULAR | Status: AC
  Filled 2013-02-17: qty 30

## 2013-02-17 MED ORDER — 0.9 % SODIUM CHLORIDE (POUR BTL) OPTIME
TOPICAL | Status: DC | PRN
  Administered 2013-02-17: 1000 mL

## 2013-02-17 MED ORDER — BACITRACIN ZINC 500 UNIT/GM EX OINT
TOPICAL_OINTMENT | CUTANEOUS | Status: DC | PRN
  Administered 2013-02-17: 1 via TOPICAL

## 2013-02-17 MED ORDER — VECURONIUM BROMIDE 10 MG IV SOLR
INTRAVENOUS | Status: DC | PRN
  Administered 2013-02-17: 2 mg via INTRAVENOUS

## 2013-02-17 MED ORDER — HYDROMORPHONE 0.3 MG/ML IV SOLN
INTRAVENOUS | Status: DC
  Administered 2013-02-17: 23:00:00 via INTRAVENOUS
  Administered 2013-02-18: 8.1 mg via INTRAVENOUS
  Administered 2013-02-18: 09:00:00 via INTRAVENOUS
  Administered 2013-02-18: 0.3 mg via INTRAVENOUS
  Administered 2013-02-18: 3.6 mg via INTRAVENOUS
  Filled 2013-02-17 (×3): qty 25

## 2013-02-17 MED ORDER — DIPHENHYDRAMINE HCL 12.5 MG/5ML PO ELIX: 12.5000 mg | ORAL_SOLUTION | Freq: Four times a day (QID) | ORAL | Status: DC | PRN

## 2013-02-17 MED ORDER — LIDOCAINE HCL (CARDIAC) 20 MG/ML IV SOLN
INTRAVENOUS | Status: DC | PRN
  Administered 2013-02-17: 70 mg via INTRAVENOUS

## 2013-02-17 MED ORDER — BUPIVACAINE HCL (PF) 0.5 % IJ SOLN
INTRAMUSCULAR | Status: DC | PRN
  Administered 2013-02-17: 30 mL

## 2013-02-17 MED ORDER — LACTATED RINGERS IV SOLN
INTRAVENOUS | Status: DC
  Administered 2013-02-17: 12:00:00 via INTRAVENOUS

## 2013-02-17 SURGICAL SUPPLY — 92 items
5 hole composite plate ×1 IMPLANT
BANDAGE ELASTIC 6 VELCRO ST LF (GAUZE/BANDAGES/DRESSINGS) ×1 IMPLANT
BANDAGE ESMARK 6X9 LF (GAUZE/BANDAGES/DRESSINGS) ×1 IMPLANT
BIT DRILL 2.5X2.75 QC CALB (BIT) ×1 IMPLANT
BIT DRILL CALIBRATED 2.7 (BIT) ×1 IMPLANT
BLADE SURG 15 STRL LF DISP TIS (BLADE) ×1 IMPLANT
BLADE SURG 15 STRL SS (BLADE) ×2
BNDG CMPR 9X6 STRL LF SNTH (GAUZE/BANDAGES/DRESSINGS) ×1
BNDG COHESIVE 4X5 TAN STRL (GAUZE/BANDAGES/DRESSINGS) ×2 IMPLANT
BNDG COHESIVE 6X5 TAN STRL LF (GAUZE/BANDAGES/DRESSINGS) ×2 IMPLANT
BNDG ESMARK 6X9 LF (GAUZE/BANDAGES/DRESSINGS) ×2
BONE CHIP PRESERV 20CC (Bone Implant) ×1 IMPLANT
CHLORAPREP W/TINT 26ML (MISCELLANEOUS) ×2 IMPLANT
CLOTH BEACON ORANGE TIMEOUT ST (SAFETY) ×2 IMPLANT
COVER SURGICAL LIGHT HANDLE (MISCELLANEOUS) ×2 IMPLANT
CUFF TOURNIQUET SINGLE 34IN LL (TOURNIQUET CUFF) ×2 IMPLANT
DRAPE C-ARM 42X72 X-RAY (DRAPES) ×2 IMPLANT
DRAPE C-ARMOR (DRAPES) ×2 IMPLANT
DRAPE INCISE IOBAN 66X45 STRL (DRAPES) ×2 IMPLANT
DRAPE ORTHO SPLIT 77X108 STRL (DRAPES) ×2
DRAPE SURG ORHT 6 SPLT 77X108 (DRAPES) ×1 IMPLANT
DRAPE U-SHAPE 47X51 STRL (DRAPES) ×2 IMPLANT
DRSG ADAPTIC 3X8 NADH LF (GAUZE/BANDAGES/DRESSINGS) ×1 IMPLANT
DRSG PAD ABDOMINAL 8X10 ST (GAUZE/BANDAGES/DRESSINGS) ×5 IMPLANT
ELECT REM PT RETURN 9FT ADLT (ELECTROSURGICAL) ×2
ELECTRODE REM PT RTRN 9FT ADLT (ELECTROSURGICAL) ×1 IMPLANT
GLOVE BIO SURGEON STRL SZ 6.5 (GLOVE) ×1 IMPLANT
GLOVE BIO SURGEON STRL SZ8 (GLOVE) ×4 IMPLANT
GLOVE BIOGEL PI IND STRL 6.5 (GLOVE) IMPLANT
GLOVE BIOGEL PI IND STRL 7.0 (GLOVE) IMPLANT
GLOVE BIOGEL PI IND STRL 8 (GLOVE) ×1 IMPLANT
GLOVE BIOGEL PI INDICATOR 6.5 (GLOVE) ×1
GLOVE BIOGEL PI INDICATOR 7.0 (GLOVE) ×1
GLOVE BIOGEL PI INDICATOR 8 (GLOVE) ×2
GOWN PREVENTION PLUS XLARGE (GOWN DISPOSABLE) ×4 IMPLANT
GOWN PREVENTION PLUS XXLARGE (GOWN DISPOSABLE) ×1 IMPLANT
GOWN STRL NON-REIN LRG LVL3 (GOWN DISPOSABLE) ×5 IMPLANT
GOWN STRL REIN 2XL XLG LVL4 (GOWN DISPOSABLE) ×1 IMPLANT
K-WIRE ACE 1.6X6 (WIRE) ×18
KIT BASIN OR (CUSTOM PROCEDURE TRAY) ×2 IMPLANT
KIT ROOM TURNOVER OR (KITS) ×2 IMPLANT
KWIRE ACE 1.6X6 (WIRE) IMPLANT
NS IRRIG 1000ML POUR BTL (IV SOLUTION) ×2 IMPLANT
PACK ORTHO EXTREMITY (CUSTOM PROCEDURE TRAY) ×2 IMPLANT
PAD ARMBOARD 7.5X6 YLW CONV (MISCELLANEOUS) ×4 IMPLANT
PAD CAST 4YDX4 CTTN HI CHSV (CAST SUPPLIES) ×1 IMPLANT
PADDING CAST COTTON 4X4 STRL (CAST SUPPLIES) ×2
PADDING CAST COTTON 6X4 STRL (CAST SUPPLIES) ×1 IMPLANT
PLATE 6H RT DIST ANTLAT TIB (Plate) ×2 IMPLANT
PLATE ANTLAT CNTR W 115X6 (Plate) IMPLANT
PLATE LOCK 7H 92 BILAT FIB (Plate) ×1 IMPLANT
SCREW CANC LAG 4X50 (Screw) ×1 IMPLANT
SCREW CORT FT 32X3.5XNONLOCK (Screw) IMPLANT
SCREW CORTICAL 3.5MM  30MM (Screw) ×2 IMPLANT
SCREW CORTICAL 3.5MM  32MM (Screw) ×3 IMPLANT
SCREW CORTICAL 3.5MM 30MM (Screw) IMPLANT
SCREW CORTICAL 3.5MM 32MM (Screw) ×3 IMPLANT
SCREW LOCK 3.5X42 DIST TIB (Screw) ×1 IMPLANT
SCREW LOCK CORT STAR 3.5X10 (Screw) ×1 IMPLANT
SCREW LOCK CORT STAR 3.5X14 (Screw) ×1 IMPLANT
SCREW LOCK CORT STAR 3.5X16 (Screw) ×1 IMPLANT
SCREW LOCK CORT STAR 3.5X18 (Screw) ×1 IMPLANT
SCREW LOCK CORT STAR 3.5X32 (Screw) ×1 IMPLANT
SCREW LOCK CORT STAR 3.5X36 (Screw) ×1 IMPLANT
SCREW LOCK CORT STAR 3.5X38 (Screw) ×1 IMPLANT
SCREW LOCK CORT STAR 3.5X40 (Screw) ×2 IMPLANT
SCREW LOCK CORT STAR 3.5X42 (Screw) ×1 IMPLANT
SCREW LOW PROFILE 18MMX3.5MM (Screw) ×2 IMPLANT
SCREW LOW PROFILE 22MMX3.5MM (Screw) ×1 IMPLANT
SCREW NON LOCKING LP 3.5 16MM (Screw) ×1 IMPLANT
SPLINT PLASTER CAST XFAST 5X30 (CAST SUPPLIES) IMPLANT
SPLINT PLASTER XFAST SET 5X30 (CAST SUPPLIES) ×1
SPONGE GAUZE 4X4 12PLY (GAUZE/BANDAGES/DRESSINGS) ×2 IMPLANT
SPONGE LAP 18X18 X RAY DECT (DISPOSABLE) ×2 IMPLANT
STAPLER VISISTAT 35W (STAPLE) IMPLANT
SUCTION FRAZIER TIP 10 FR DISP (SUCTIONS) ×2 IMPLANT
SUT ETHILON 2 0 FS 18 (SUTURE) ×1 IMPLANT
SUT ETHILON 3 0 FSL (SUTURE) ×1 IMPLANT
SUT MNCRL AB 3-0 PS2 18 (SUTURE) ×2 IMPLANT
SUT PROLENE 3 0 PS 2 (SUTURE) ×2 IMPLANT
SUT VIC AB 0 CT1 27 (SUTURE) ×4
SUT VIC AB 0 CT1 27XBRD ANBCTR (SUTURE) IMPLANT
SUT VIC AB 2-0 CT1 27 (SUTURE) ×4
SUT VIC AB 2-0 CT1 TAPERPNT 27 (SUTURE) ×2 IMPLANT
SUT VIC AB 3-0 PS2 18 (SUTURE) ×6
SUT VIC AB 3-0 PS2 18XBRD (SUTURE) ×1 IMPLANT
SYR CONTROL 10ML LL (SYRINGE) ×1 IMPLANT
TOWEL OR 17X24 6PK STRL BLUE (TOWEL DISPOSABLE) ×2 IMPLANT
TOWEL OR 17X26 10 PK STRL BLUE (TOWEL DISPOSABLE) ×2 IMPLANT
TUBE CONNECTING 12X1/4 (SUCTIONS) ×2 IMPLANT
WATER STERILE IRR 1000ML POUR (IV SOLUTION) ×2 IMPLANT
YANKAUER SUCT BULB TIP NO VENT (SUCTIONS) ×1 IMPLANT

## 2013-02-17 NOTE — Brief Op Note (Signed)
02/09/2013 - 02/17/2013  5:03 PM  PATIENT:  [name]  37 y.o. male  PRE-OPERATIVE DIAGNOSIS:  RIGHT TIBIAL PILON AND FIBULA FRACTURE s/p closed reduction and external fixation  POST-OPERATIVE DIAGNOSIS: same  Procedure(s): 1.  ORIF right tibial pilon and fibula fractures 2.  Removal of external fixator under anesthesia  SURGEON:  [name], MD  ASSISTANT: n/a  ANESTHESIA:   General  EBL:  75 cc  TOURNIQUET:   Total Tourniquet Time Documented: Thigh (Right) - 126 minutes Total: Thigh (Right) - 126 minutes   COMPLICATIONS:  None apparent  DISPOSITION:  Extubated, awake and stable to recovery.  DICTATION ID:  [id number]

## 2013-02-17 NOTE — Progress Notes (Signed)
To OR today with Dr. Victorino Dike. Has been progressing well with therapies. Patient examined and I agree with the assessment and plan  Violeta Gelinas, MD, MPH, FACS Pager: [phone]  02/17/2013 12:38 PM

## 2013-02-17 NOTE — OR Nursing (Signed)
1230 - patient voided 200 ml prior to surgery.

## 2013-02-17 NOTE — Anesthesia Preprocedure Evaluation (Signed)
Anesthesia Evaluation  Patient identified by MRN, date of birth, ID band Patient awake    Reviewed: Allergy & Precautions, H&P , NPO status , Patient's Chart, lab work & pertinent test results  Airway Mallampati: II  Neck ROM: full    Dental   Pulmonary Current Smoker,          Cardiovascular     Neuro/Psych    GI/Hepatic   Endo/Other    Renal/GU      Musculoskeletal   Abdominal   Peds  Hematology   Anesthesia Other Findings   Reproductive/Obstetrics                           Anesthesia Physical Anesthesia Plan  ASA: II  Anesthesia Plan: General   Post-op Pain Management:    Induction: Intravenous  Airway Management Planned: Oral ETT  Additional Equipment:   Intra-op Plan:   Post-operative Plan: Extubation in OR  Informed Consent: I have reviewed the patients History and Physical, chart, labs and discussed the procedure including the risks, benefits and alternatives for the proposed anesthesia with the patient or authorized representative who has indicated his/her understanding and acceptance.     Plan Discussed with: CRNA and Surgeon  Anesthesia Plan Comments:         Anesthesia Quick Evaluation  

## 2013-02-17 NOTE — Anesthesia Procedure Notes (Signed)
Procedure Name: Intubation Date/Time: 02/17/2013 12:43 PM Performed by: Jerilee Hoh Pre-anesthesia Checklist: Patient identified, Emergency Drugs available, Suction available and Patient being monitored Patient Re-evaluated:Patient Re-evaluated prior to inductionOxygen Delivery Method: Circle system utilized Preoxygenation: Pre-oxygenation with 100% oxygen Intubation Type: IV induction Ventilation: Mask ventilation without difficulty Laryngoscope Size: Mac and 4 Grade View: Grade I Tube type: Oral Tube size: 8.0 mm Number of attempts: 1 Airway Equipment and Method: Stylet Placement Confirmation: ETT inserted through vocal cords under direct vision,  positive ETCO2 and breath sounds checked- equal and bilateral Secured at: 22 cm Tube secured with: Tape Dental Injury: Teeth and Oropharynx as per pre-operative assessment

## 2013-02-17 NOTE — Transfer of Care (Signed)
Immediate Anesthesia Transfer of Care Note  Patient: [name]  Procedure(s) Performed: Procedure(s): OPEN REDUCTION INTERNAL FIXATION (ORIF) RIGHT TIBIAL PILON AND ORIF RIGHT FIBULA FRACTURE AND REMOVAL OF SYNTHESE EXFIX (Right)  Patient Location: PACU  Anesthesia Type:General  Level of Consciousness: awake and alert   Airway & Oxygen Therapy: Patient connected to nasal cannula oxygen  Post-op Assessment: Report given to PACU RN  Post vital signs: stable  Complications: No apparent anesthesia complications

## 2013-02-17 NOTE — Progress Notes (Signed)
Subjective: Pt without c/o this morning.  + bm.  No N/v.   Objective: Vital signs in last 24 hours: Temp:  [97.9 F (36.6 C)-98.4 F (36.9 C)] 98 F (36.7 C) 07-Mar-2023 1043) Pulse Rate:  [65-78] 78 March 07, 2023 1043) Resp:  [11-19] 11 2023-03-07 1043) BP: (103-138)/(53-81) 138/81 mmHg 03-07-2023 1043) SpO2:  [92 %-98 %] 98 % March 07, 2023 1043)  Intake/Output from previous day: 03/12 0701 - 03/07/2023 0700 In: 2830.8 [P.O.:960; I.V.:1870.8] Out: 2075 [Urine:2075] Intake/Output this shift: Total I/O In: 0  Out: 300 [Urine:300]   Recent Labs  02/16/13 0837 02/16/13 2011  HGB 9.8* 10.0*    Recent Labs  02/16/13 0837 02/16/13 2011  WBC 7.4 9.3  RBC 3.30* 3.29*  HCT 27.9* 28.3*  PLT 347 386    Recent Labs  02/16/13 0837 02/16/13 2011  NA 135 137  K 3.6 4.0  CL 98 98  CO2 29 30  BUN 7 7  CREATININE 0.63 0.66  GLUCOSE 99 93  CALCIUM 8.7 8.8   No results found for this basename: LABPT, INR,  in the last 72 hours  Compartment soft R LE with ex fix in place.  NVI at toes.  5/5 strength in PF and DF of toes.  Assessment/Plan: Right tibial pilon and fibula fractures - to OR for ORIF.  The risks and benefits of the alternative treatment options have been discussed in detail.  The patient wishes to proceed with surgery and specifically understands risks of bleeding, infection, nerve damage, blood clots, need for additional surgery, amputation and death.    [name] 03/06/2013, 12:23 PM

## 2013-02-17 NOTE — Preoperative (Signed)
Beta Blockers   Reason not to administer Beta Blockers:Not Applicable 

## 2013-02-17 NOTE — Progress Notes (Signed)
Patient ID: [name], male   DOB: February 20, 1976, 37 y.o.   MRN: [id number]   LOS: 8 days  POD#8  Subjective: Pain controlled, +flatus, no N/V   Objective: Vital signs in last 24 hours: Temp:  [97.9 F (36.6 C)-98.4 F (36.9 C)] 98.4 F (36.9 C) (03/13 0558) Pulse Rate:  [65-76] 65 (03/13 0558) Resp:  [13-19] 15 (03/13 0730) BP: (103-129)/(53-77) 103/53 mmHg (03/13 0558) SpO2:  [92 %-98 %] 94 % (03/13 0730) Last BM Date: 02/09/13   Physical Exam General appearance: alert and no distress Resp: clear to auscultation bilaterally Cardio: regular rate and rhythm GI: normal findings: bowel sounds normal and soft, appropriately TTP, incision C/D/I Extremities: NVI   Assessment/Plan: Blunt abdominal trauma  Mesenteric injury s/p ex lap, SBR  Ileus -- Resolved, will advance diet after surgery. D/C reglan. Right ankle fx s/p ex fix -- Dr. Victorino Dike to return to OR today ID -- On Rocephin D5 for suspected PNA, culture with few Strep. Will plan to d/c at discharge. ABL anemia -- Stable  FEN -- Change to oral pain meds after surgery VTE -- SCD's, Lovenox  Dispo -- Home possibly tomorrow    Freeman Caldron, PA-C Pager: [phone] General Trauma PA Pager: [phone]   02/17/2013

## 2013-02-18 DIAGNOSIS — G8918 Other acute postprocedural pain: Secondary | ICD-10-CM

## 2013-02-18 MED ORDER — RIVAROXABAN 10 MG PO TABS: 10.0000 mg | ORAL_TABLET | Freq: Every day | ORAL | Status: DC

## 2013-02-18 MED ORDER — TRAMADOL HCL 50 MG PO TABS
100.0000 mg | ORAL_TABLET | Freq: Four times a day (QID) | ORAL | Status: DC
  Administered 2013-02-18 – 2013-02-19 (×4): 100 mg via ORAL
  Filled 2013-02-18 (×7): qty 2

## 2013-02-18 MED ORDER — HYDROMORPHONE HCL PF 1 MG/ML IJ SOLN: 1.0000 mg | INTRAMUSCULAR | Status: DC | PRN

## 2013-02-18 MED ORDER — OXYCODONE HCL ER 15 MG PO T12A
60.0000 mg | EXTENDED_RELEASE_TABLET | Freq: Two times a day (BID) | ORAL | Status: DC
  Administered 2013-02-18 – 2013-02-19 (×3): 60 mg via ORAL
  Filled 2013-02-18 (×3): qty 4

## 2013-02-18 MED ORDER — OXYCODONE HCL 5 MG PO TABS
10.0000 mg | ORAL_TABLET | ORAL | Status: DC | PRN
  Administered 2013-02-18: 15 mg via ORAL
  Administered 2013-02-18 – 2013-02-19 (×6): 20 mg via ORAL
  Filled 2013-02-18: qty 4
  Filled 2013-02-18: qty 2
  Filled 2013-02-18: qty 1
  Filled 2013-02-18 (×4): qty 4
  Filled 2013-02-18: qty 3
  Filled 2013-02-18: qty 1

## 2013-02-18 NOTE — Progress Notes (Signed)
PT Cancellation Note  Patient Details Name: [name] MRN: [id number] DOB: 1976-06-30   Cancelled Treatment:    Reason Eval/Treat Not Completed: Pain limiting ability to participate   Greggory Stallion 02/18/2013, 8:16 AM

## 2013-02-18 NOTE — Progress Notes (Signed)
May be that splint is too tight or maybe expected pain level.  Some tenderness with passive extension of toes.  This patient has been seen and I agree with the findings and treatment plan.  Marta Lamas. Gae Bon, MD, FACS [phone] (pager) [phone] (direct pager) Trauma Surgeon

## 2013-02-18 NOTE — Anesthesia Postprocedure Evaluation (Signed)
  Anesthesia Post-op Note  Patient: [name]  Procedure(s) Performed: Procedure(s): OPEN REDUCTION INTERNAL FIXATION (ORIF) RIGHT TIBIAL PILON AND ORIF RIGHT FIBULA FRACTURE AND REMOVAL OF SYNTHESE EXFIX (Right)  Patient Location: PACU  Anesthesia Type:General  Level of Consciousness: awake and alert   Airway and Oxygen Therapy: Patient Spontanous Breathing and Patient connected to nasal cannula oxygen  Post-op Pain: mild  Post-op Assessment: Post-op Vital signs reviewed, Patient's Cardiovascular Status Stable, Respiratory Function Stable and Patent Airway  Post-op Vital Signs: Reviewed and stable  Complications: No apparent anesthesia complications

## 2013-02-18 NOTE — Op Note (Signed)
NAMESYLUS, STGERMAIN NO.:  [id number]  MEDICAL RECORD NO.:  [id number]  LOCATION:  6N02C                        FACILITY:  [facility]  PHYSICIAN:  [name], MD        DATE OF BIRTH:  May 07, 1976  DATE OF PROCEDURE:  02/17/2013 DATE OF DISCHARGE:                              OPERATIVE REPORT   PREOPERATIVE DIAGNOSIS:  Right tibial pilon and fibula fractures status post closed reduction and external fixation.  POSTOPERATIVE DIAGNOSIS:  Right tibial pilon and fibula fractures status post closed reduction and external fixation.  PROCEDURE: 1. Open reduction and internal fixation of right tibial pilon and     fibular fractures. 2. Removal of external fixator under anesthesia.  SURGEON:  [name], MD  ANESTHESIA:  General.  ESTIMATED BLOOD LOSS:  75 mL.  TOURNIQUET TIME:  2 hours and 6 minutes at 250 mmHg.  COMPLICATIONS:  None apparent.  DISPOSITION:  Extubated awake and stable to recovery.  INDICATIONS FOR PROCEDURE:  The patient is a 37 year old male who sustained a right tibial pilon and fibular fracture approximately 8 days ago.  He underwent closed reduction and application of an external fixator the night of his injury.  He has had improvement in the condition of the skin to allow surgical treatment of this fracture at this time.  This is a plan to return to the operating room for a staged repair of this severely comminuted and displaced distal fibula and tibial pilon fractures.  The patient understands the risks and benefits, the alternative treatment options and elects surgical treatment.  He specifically understands risks of bleeding, infection, nerve damage, blood clots, need for additional surgery, amputation, and death.  PROCEDURE IN DETAIL:  After preoperative consent was obtained and the correct operative site was identified.  The patient was brought to the operating room and placed supine on the operating table.  General anesthesia was  induced, preoperative antibiotics were administered, surgical time-out was taken.  The patient was then turned to the lateral decubitus position with the right side up.  The right lower extremity was prepped and draped in standard sterile fashion with a tourniquet around the thigh.  The extremity was exsanguinated and the tourniquet was inflated to 250 mmHg.  A longitudinal incision was made at the interval between the fibula and the Achilles tendon.  Sharp dissection was carried down through the skin and subcutaneous tissue.  Care was taken to protect the branches of the sural nerve.  The interval between the peroneals and the Achilles was developed.  The flexor hallucis longus muscle was noted to be quite severely injured with much of the muscle transected.  This was all debrided sharply.  The fracture site, posterior aspect of the tibia was identified.  The fracture fragment was reduced and provisionally held with a tenaculum.  A nonlocking combination LCDC 1/3 tubular plate was selected.  It was applied in a buttress fashion and secured proximally with 2 bicortical 3.5-mm fully- threaded screws.  This held the fragment in a reduced position.  AP and lateral fluoroscopic images confirmed appropriate reduction and appropriate position length of all hardware.  At this point, attention was turned to the fibula.  Dissection was  carried anterior to the peroneal tendon.  The fibula fracture was identified and was noted to be short and transverse.  There was significant comminution anteriorly.  The fibula was provisionally reduced and a K-wire was inserted from the tip of the fibula, across the fracture site to hold it.  A 7-hole locking plate was selected from the Biomet set.  It was secured proximally with a pin distally with pin.  AP and lateral fluoroscopic images confirmed appropriate position of the plate.  Distally, the plate was contoured to fit the fibula.  It was fixed distally  with a nonlocking screw pulling the plate securely down to the bone and proximally with a nonlocking screw again pulling the plate securely down to bone.  Two more locking screws were then placed distally and the nonlocking screw was removed and replaced with a locking screw.  All 3 of these were unicortical.  The remaining two holes proximally were filled with a nonlocking bicortical screws.  The hole over the fracture site was left open.  The wound was irrigated copiously.  AP and lateral fluoroscopic images confirmed appropriate position and length of all hardware and appropriate reduction of the fibula and posterior tibial fractures.  The wound was irrigated copiously.  The superior peroneal retinaculum was repaired with 0 Vicryl figure-of-eight sutures.  Subcutaneous tissue was approximated with inverted simple sutures of 3-0 Monocryl and a running 3-0 nylon correct that running 3-0 Prolene was used to close the skin incision.  Attention was then turned to the anterior aspect of the ankle where longitudinal incision was made over the extensor hallucis longus tendon sheath.  Sharp dissection was carried down through the skin and subcutaneous tissue.  The extensor hallucis longus tendon sheath was opened and released proximally and distally.  The interval between the tibialis anterior and extensor hallucis longus was developed taking care to protect the neurovascular bundle.  The distal tibia fracture was noted to be severely comminuted.  There was significant bone loss distally.  There were multiple devitalized fragments of bone including a large anterior cortical fragment.  There was also a large intra- articular fragment that essentially was just subchondral bone and cartilage and was inadequate for fixation.  All the devitalized fragments were removed.  The wound was irrigated copiously and cleaned of all hematoma.  The fracture fragments were provisionally reduced and pinned in  position.  AP and lateral fluoroscopic images confirmed appropriate alignment.  A Biomet locking distal tibial plate was selected and placed anteriorly.  It was provisionally pinned distally. Approximately an oblong hole was drilled and a provisional fixation pin was inserted.  This secured the plate against the anterolateral cortex of the tibia.  AP and lateral fluoroscopic images confirmed appropriate reduction of the fracture site.  Proximally, the plate was secured with two nonlocking 3.5-mm fully-threaded screws.  Distally, a 4 mm partially threaded cancellous screw was inserted.  This pulled the anterior fragment to the posterior fragments together and restored unacceptable contour of the distal tibia on the lateral view.  The proximal screws in the distal limb of the plate were then all drilled and filled with locking screws and the 4-0 cancellous screw was removed and replaced with a locking screw.  The distal 3 screw holes were then drilled and filled with locking screws.  AP and lateral fluoroscopic images confirmed appropriate position and length of all hardware.  Another nonlocking 3.5-mm fully-threaded bicortical screw was inserted proximally.  At this point, the external fixator was removed.  The two Schanz pins were removed from the tibia and the calcaneal pin was removed as well. The tibial and calcaneal pin holes were all curetted and irrigated copiously.  Approximately 15 mL of cancellous allograft, bone chips were packed into the void at the distal tibia.  This was done after irrigating the wound again copiously.  Final AP, mortise, and lateral views were obtained of the ankle showing acceptable reduction of the fracture and appropriate position and length of all hardware.  The extensor retinaculum was then repaired over the anterior tendons with 0 Vicryl figure-of-eight and running sutures.  Subcutaneous tissue was approximated with inverted simple sutures of 3-0  Monocryl with a running 3-0 nylon was used to close the skin incision.  Sterile dressings were applied followed by a well-padded short-leg splint.  Tourniquet had been released at 2 hours and 6 minutes and hemostasis was achieved prior to closure.  The patient was then awakened from anesthesia and transported to recovery room in stable condition.  FOLLOWUP PLAN:  The patient will be nonweightbearing on the right lower extremity.  He will be admitted back to the Trauma Service and will follow up in clinic in 2 to 3 weeks.     [name], MD     JH/MEDQ  D:  02/17/2013  T:  02/18/2013  Job:  [id number]

## 2013-02-18 NOTE — Progress Notes (Signed)
Subjective: 1 Day Post-Op Procedure(s) (LRB): OPEN REDUCTION INTERNAL FIXATION (ORIF) RIGHT TIBIAL PILON AND ORIF RIGHT FIBULA FRACTURE AND REMOVAL OF SYNTHESE EXFIX (Right) Patient reports pain as moderate.  OOB with PT x 1.  Refused due to pain a second round of PT.  No n/v.  Foley still in.  Objective: Vital signs in last 24 hours: Temp:  [98.1 F (36.7 C)-99.5 F (37.5 C)] 99.2 F (37.3 C) (03/14 0500) Pulse Rate:  [78-114] 98 (03/14 0930) Resp:  [10-21] 19 (03/14 0930) BP: (125-162)/(69-87) 153/83 mmHg (03/14 0930) SpO2:  [90 %-99 %] 93 % (03/14 0930) FiO2 (%):  [93 %-94 %] 93 % (03/14 0845)  Intake/Output from previous day: 03/13 0701 - 03/14 0700 In: 2681.7 [P.O.:240; I.V.:2441.7] Out: 3100 [Urine:3100] Intake/Output this shift: Total I/O In: 360 [P.O.:360] Out: -    Recent Labs  02/16/13 0837 02/16/13 2011 02/17/13 1808  HGB 9.8* 10.0* 10.5*    Recent Labs  02/16/13 2011 02/17/13 1808  WBC 9.3 11.5*  RBC 3.29* 3.51*  HCT 28.3* 30.3*  PLT 386 483*    Recent Labs  02/16/13 0837 02/16/13 2011 02/17/13 1808  NA 135 137  --   K 3.6 4.0  --   CL 98 98  --   CO2 29 30  --   BUN 7 7  --   CREATININE 0.63 0.66 0.79  GLUCOSE 99 93  --   CALCIUM 8.7 8.8  --    No results found for this basename: LABPT, INR,  in the last 72 hours  R LE immobilized in splint.  Wiggles toes.  Feels LT.  brisk cap refill at toes.  Assessment/Plan: 1 Day Post-Op Procedure(s) (LRB): OPEN REDUCTION INTERNAL FIXATION (ORIF) RIGHT TIBIAL PILON AND ORIF RIGHT FIBULA FRACTURE AND REMOVAL OF SYNTHESE EXFIX (Right) Up with therapy  D/c foley.  Plan 2 weeks of anticoag with xarelto or lovenox. Since he's a smoker.  Then switch to aspirin.  Continue NWB on R LE.  F/u with me in two weeks.  [name] 02/18/2013, 12:19 PM

## 2013-02-18 NOTE — Progress Notes (Signed)
Pt refused second attempt to mobilize today reporting his pain is a little better, but he does not want to make it hurt by moving. Will attempt to see pt tomorrow.

## 2013-02-18 NOTE — Discharge Instructions (Addendum)
 Amada Backer, MD [facility]  Please read the following information regarding your care after surgery.  NO SMOKING!  Medications  You only need a prescription for the narcotic pain medicine (ex. oxycodone , Percocet, Norco).  All of the other medicines listed below are available over the counter. X acetominophen (Tylenol ) 650 mg every 4-6 hours as you need for minor pain X oxycodone  as prescribed for moderate to severe pain X xarelto  for two weeks as prescribed to prevent blood clots.   Narcotic pain medicine (ex. oxycodone , Percocet, Vicodin) will cause constipation.  To prevent this problem, take the following medicines while you are taking any pain medicine. X docusate sodium  (Colace) 100 mg twice a day X senna (Senokot) 2 tablets twice a day  X To help prevent blood clots, take an aspirin (325 mg) once a day for a month after you stop Xarelto .  You should also get up every hour while you are awake to move around.    Weight Bearing ? Bear weight when you are able on your operated leg or foot. ? Bear weight only on the heel of your operated foot in the post-op shoe. X Do not bear any weight on the operated leg or foot.  Cast / Splint / Dressing X Keep your splint or cast clean and dry.  Don't put anything (coat hanger, pencil, etc) down inside of it.  If it gets damp, use a hair dryer on the cool setting to dry it.  If it gets soaked, call the office to schedule an appointment for a cast change. ? Remove your dressing 3 days after surgery and cover the incisions with dry dressings.    After your dressing, cast or splint is removed; you may shower, but do not soak or scrub the wound.  Allow the water to run over it, and then gently pat it dry.  Swelling It is normal for you to have swelling where you had surgery.  To reduce swelling and pain, keep your toes above your nose for at least 3 days after surgery.  It may be necessary to keep your foot or leg elevated for several  weeks.  If it hurts, it should be elevated.  Follow Up Call my office at [phone] when you are discharged from the hospital or surgery center to schedule an appointment to be seen two weeks after surgery.  Call my office at [phone] if you develop a fever >101.5 F, nausea, vomiting, bleeding from the surgical site or severe pain.     No lifting more than 5 pounds until 03/23/13. No driving while taking oxycodone .

## 2013-02-18 NOTE — Progress Notes (Signed)
Patient ID: [name], male   DOB: 1975/12/13, 37 y.o.   MRN: [id number]  LOS: 9 days   Subjective: Pt still complaining of poorly controlled pain, rating it a 7-8/10 constantly, with a max of 10/10 in the last 12 hrs. Pain is localized to ankle. He denies abdominal pain, N/V. He has had flatus and 1 BM yesterday.   Objective: Vital signs in last 24 hours: Temp:  [98 F (36.7 C)-99.5 F (37.5 C)] 99.2 F (37.3 C) (03/14 0500) Pulse Rate:  [78-114] 98 (03/14 0500) Resp:  [10-21] 19 (03/14 0500) BP: (125-162)/(69-87) 134/81 mmHg (03/14 0500) SpO2:  [90 %-99 %] 94 % (03/14 0500) FiO2 (%):  [94 %] 94 % (03/13 2233) Last BM Date: 02/17/13   Laboratory  CBC  Recent Labs  02/16/13 2011 02/17/13 1808  WBC 9.3 11.5*  HGB 10.0* 10.5*  HCT 28.3* 30.3*  PLT 386 483*   BMET  Recent Labs  02/16/13 0837 02/16/13 2011 02/17/13 1808  NA 135 137  --   K 3.6 4.0  --   CL 98 98  --   CO2 29 30  --   GLUCOSE 99 93  --   BUN 7 7  --   CREATININE 0.63 0.66 0.79  CALCIUM 8.7 8.8  --      Physical Exam General appearance: alert, cooperative and mild distress Resp: clear to auscultation bilaterally Cardio: regular rate and rhythm, S1, S2 normal, no murmur, click, rub or gallop GI: soft, non-tender; bowel sounds normal; no masses,  no organomegaly and incision C/D/I Extremities: toes are warm to touch with normal capillary refill   Assessment/Plan: Blunt abdominal trauma  Mesenteric injury s/p ex lap, SBR  Right ankle fx s/p internal fix  ID -- On Rocephin D6 for suspected PNA, culture with few Strep. Will plan to d/c at discharge.  ABL anemia -- Stable  FEN -- D/C PCA, try to control pain with orals  VTE -- SCD's, Lovenox  Dispo -- Home when pain controlled  Ralene Muskrat, PA-S    02/18/2013

## 2013-02-19 MED ORDER — PANTOPRAZOLE SODIUM 40 MG PO TBEC
40.0000 mg | DELAYED_RELEASE_TABLET | Freq: Two times a day (BID) | ORAL | Status: DC
  Administered 2013-02-19: 40 mg via ORAL
  Filled 2013-02-19: qty 1

## 2013-02-19 MED ORDER — TRAMADOL HCL 50 MG PO TABS: 100.0000 mg | ORAL_TABLET | Freq: Four times a day (QID) | ORAL | Status: DC

## 2013-02-19 MED ORDER — POLYETHYLENE GLYCOL 3350 17 GM/SCOOP PO POWD: 17.0000 g | Freq: Every day | ORAL | Status: AC

## 2013-02-19 MED ORDER — DSS 100 MG PO CAPS: 100.0000 mg | ORAL_CAPSULE | Freq: Two times a day (BID) | ORAL | Status: DC | PRN

## 2013-02-19 MED ORDER — OXYCODONE HCL ER 60 MG PO T12A: 60.0000 mg | EXTENDED_RELEASE_TABLET | Freq: Two times a day (BID) | ORAL | Status: DC

## 2013-02-19 MED ORDER — OXYCODONE-ACETAMINOPHEN 10-325 MG PO TABS: 1.0000 | ORAL_TABLET | ORAL | Status: AC | PRN

## 2013-02-19 MED ORDER — OXYCODONE HCL ER 80 MG PO T12A: 80.0000 mg | EXTENDED_RELEASE_TABLET | Freq: Two times a day (BID) | ORAL | Status: DC

## 2013-02-19 NOTE — Discharge Summary (Signed)
Physician Discharge Summary  Patient ID: [name] MRN: [id number] DOB/AGE: 1976/02/23 37 y.o.  Admit date: 02/09/2013 Discharge date: 02/19/2013  Discharge Diagnoses Patient Active Problem List   Diagnosis Date Noted  . Pneumonia 02/17/2013  . Blunt abdominal trauma 02/10/2013  . Injury of mesentery 02/10/2013  . Acute blood loss anemia 02/10/2013  . Closed right pilon fracture 02/10/2013    Consultants Drs. Teryl Lucy and [name] for orthopedic surgery   Procedures Exploratory laparotomy with small bowel resection and repair of mesentery by Dr. Jimmye Norman  External fixation of right pilon fracture by Dr. Dion Saucier  Removal of external fixators and ORIF of right pilon fracture by Dr. Victorino Dike   HPI: The patient is a tree cutter. Impinged between a cut tree limb and the tree, crushing his left chest and abdomen. Had positive FAST examination by EDP, confirmed by TS. As he was stable he was imaged with a CT scan of the abdomen and pelvis and a mesenteric injury with active extravasation was confirmed. He was also found to have the ankle fracture. He was taken urgently to the OR for the first two procedures.   Hospital Course: The patient did well with surgery. He had some acute blood loss anemia that did not require transfusion. He had the expected post-operative ileus that gradually improved with time. His diet was able to be advanced to regular by the time he was discharged. He was taken back to the OR a little over a week after his injury to have definitive repair done on his ankle. He had significant pain after this and required a PCA followed by a combination of oral short- and long-acting pain medicines to control it. He was mobilized with physical and occupational therapies and did well. Once his pain was under control he was discharged home in good condition in the care of his wife.      Medication List    TAKE these medications       DSS 100 MG Caps  Take  100 mg by mouth 2 (two) times daily as needed.     OxyCODONE 80 mg T12a  Commonly known as:  OXYCONTIN  Take 1 tablet (80 mg total) by mouth every 12 (twelve) hours.     OxyCODONE HCl ER 60 MG T12a  Take 60 mg by mouth every 12 (twelve) hours. Do not fill before 02/23/13     oxyCODONE-acetaminophen 10-325 MG per tablet  Commonly known as:  PERCOCET  Take 1-2 tablets by mouth every 4 (four) hours as needed for pain.     polyethylene glycol powder powder  Commonly known as:  GLYCOLAX/MIRALAX  Take 17 g by mouth daily.     rivaroxaban 10 MG Tabs tablet  Commonly known as:  XARELTO  Take 1 tablet (10 mg total) by mouth daily.     traMADol 50 MG tablet  Commonly known as:  ULTRAM  Take 2 tablets (100 mg total) by mouth every 6 (six) hours.             Follow-up Information   Follow up with HEWITT, Jonny Ruiz, MD. Schedule an appointment as soon as possible for a visit in 2 weeks.   Contact information:   [address], [address] [place] [address] [phone]       Call [facility]. (As needed)    Contact information:   [address] [place] [address] [phone]      Discharge planning took  greater than 30 minutes.   Signed: Freeman Caldron, PA-C Pager: [phone] General Trauma PA Pager: [phone]  02/19/2013, 12:37 PM

## 2013-02-19 NOTE — Progress Notes (Signed)
Patient ID: [name], male   DOB: August 20, 1976, 37 y.o.   MRN: [id number]   LOS: 10 days   Subjective: Pain decently controlled. Ready to go home.  Objective: Vital signs in last 24 hours: Temp:  [98.3 F (36.8 C)-99.9 F (37.7 C)] 98.3 F (36.8 C) (03/15 0556) Pulse Rate:  [88-100] 88 (03/15 0556) Resp:  [16-18] 18 (03/15 0556) BP: (124-146)/(76-93) 124/76 mmHg (03/15 0556) SpO2:  [93 %-97 %] 95 % (03/15 0556) Last BM Date: 02/17/13   Physical Exam General appearance: alert and no distress Resp: clear to auscultation bilaterally Cardio: regular rate and rhythm GI: Soft, NT, +BS Extremities: NVI   Assessment/Plan: Blunt abdominal trauma  Mesenteric injury s/p ex lap, SBR  Right ankle fx s/p internal fix  ID -- On Rocephin D7 for suspected PNA, culture with few Strep. Will plan to d/c at discharge.  ABL anemia -- Stable  Dispo -- Home when     Freeman Caldron, PA-C Pager: [phone] General Trauma PA Pager: [phone]   02/19/2013

## 2013-02-19 NOTE — Progress Notes (Signed)
   CARE MANAGEMENT NOTE 02/19/2013  Patient:  SUVAN, STCYR   Account Number:  [id number]  Date Initiated:  02/10/2013  Documentation initiated by:  Ronny Flurry  Subjective/Objective Assessment:   crushed by tree limb     Action/Plan:   Anticipated DC Date:  02/19/2013   Anticipated DC Plan:  HOME W HOME HEALTH SERVICES  In-house referral  Financial Counselor      DC Planning Services  CM consult  MATCH Program      Choice offered to / List presented to:          [facility] arranged  HH-1 RN  HH-2 PT      [facility] agency  [facility]   Status of service:  Completed, signed off Medicare Important Message given?   (If response is "NO", the following Medicare IM given date fields will be blank) Date Medicare IM given:   Date Additional Medicare IM given:    Discharge Disposition:  HOME W HOME HEALTH SERVICES  Per UR Regulation:    If discussed at Long Length of Stay Meetings, dates discussed:   02/15/2013    Comments:  02/19/2014 1400 NCM spoke to pt and state she has no drug coverage or insurance at this time. Consulted with Asst Director CM. Pt provided 3 days of narcotics from main pharmacy using [employer] fund. MATCH letter given for other medications. Explained 02/21/2013 will follow up on additional 11 days for narcotics to assist using MATCH. Pt believes he can get out of pocket. Explained MATCH can be used once per year. Notified [facility] of pts scheduled dc home today. Isidoro Donning RN CCM Case Mgmt phone [phone]   02/15/2013 Carlyle Lipa, RN BSN [facility] CCM 1520--Pt remains on acute unit. Ileus beginning to resolve--tolerated NGT clamped for 3 hrs so it was d/c and clear diet started today. Will go back to OR on 3/13 for ortho repairs. CIR vs HH after that depending on abilities.  02-10-13 Referral regarding hospital bill and cost of home health .  Asked finanical counselor Merry Proud Freer 2 [phone])  to call or visit patient in room regarding hospital bill.   Spoke  with Jodene Nam at [facility] regarding home health . If patient 's and his wife's combined income is less than 31, 020 a year they can have HHRN and PT for 1 month under charity .   Spoke with patient's wife Valentina Gu [phone] ) , explained this she stated their combined income is less than 31, 020 . She would like patient to have HHRN and PT .  Mary from [facility] will speak to patient's wife tomorrow regarding same.  Patient's wife stated her and patient are separated as of 3 weeks ago , however , they do not have legal separation papers and they do live together . Valentina Gu is planning on caring for patient at discharge , she states she is a Engineer, civil (consulting) but not working at present ( still wants [facility] )  Ronny Flurry RN BSN [phone]

## 2013-02-19 NOTE — Progress Notes (Signed)
I have seen and examined the pt and agree with PA-Jeffery's note. 

## 2013-02-19 NOTE — Progress Notes (Signed)
Physical Therapy Treatment Patient Details Name: [name] MRN: [id number] DOB: [birth year] Today's Date: 02/19/2013 Time:  -     PT Assessment / Plan / Recommendation Comments on Treatment Session  Pt moving fairly well.  Pt reports pain better controlled today & is eager to d/c home.  Practiced 1 step to mimic getting in/out of house & he states he can stay on main level once inside home.      Follow Up Recommendations  Home health PT;Supervision/Assistance - 24 hour     Does the patient have the potential to tolerate intense rehabilitation     Barriers to Discharge        Equipment Recommendations  Rolling walker with 5" wheels    Recommendations for Other Services    Frequency Min 5X/week   Plan Discharge plan remains appropriate;Frequency remains appropriate    Precautions / Restrictions Precautions Precautions: Fall Restrictions RLE Weight Bearing: Non weight bearing       Mobility  Bed Mobility Bed Mobility: Supine to Sit;Sitting - Scoot to Edge of Bed Supine to Sit: 6: Modified independent (Device/Increase time);HOB flat Sitting - Scoot to Edge of Bed: 6: Modified independent (Device/Increase time) Transfers Transfers: Sit to Stand;Stand to Sit Sit to Stand: 5: Supervision;With upper extremity assist;From bed;From chair/3-in-1;With armrests Stand to Sit: 5: Supervision;With armrests;With upper extremity assist;To chair/3-in-1 Details for Transfer Assistance: (S) for safety & cues to reinforce safest hand placement Ambulation/Gait Ambulation/Gait Assistance: 4: Min guard Ambulation Distance (Feet): 30 Feet Assistive device: Rolling walker Ambulation/Gait Assistance Details: Cues for increased smoothness with directional changes.   Gait Pattern: Step-to pattern Gait velocity: wfl Stairs: Yes Stairs Assistance: 4: Min assist Stairs Assistance Details (indicate cue type and reason): (A) for balance & safety.  Cues for technique.  Pt performed 1 step  backwards 3x's.   Pt states he will not have to negotiate multiple steps (he can stay on main level).   Stair Management Technique: No rails;Backwards;With walker Number of Stairs: 1 (3x's) Wheelchair Mobility Wheelchair Mobility: No      PT Goals Acute Rehab PT Goals Time For Goal Achievement: 02/23/13 Potential to Achieve Goals: Good Pt will go Supine/Side to Sit: with modified independence;with HOB 0 degrees PT Goal: Supine/Side to Sit - Progress: Met Pt will go Sit to Stand: with modified independence;with upper extremity assist PT Goal: Sit to Stand - Progress: Progressing toward goal Pt will Ambulate: 51 - 150 feet;with supervision;with least restrictive assistive device PT Goal: Ambulate - Progress: Progressing toward goal Pt will Go Up / Down Stairs: 1-2 stairs;with min assist;with crutches PT Goal: Up/Down Stairs - Progress: Progressing toward goal Additional Goals Additional Goal #1: Pt independent with HEP.  Visit Information  Last PT Received On: 02/19/13 Assistance Needed: +1    Subjective Data  Subjective: "Im so ready to go home   Cognition  Cognition Overall Cognitive Status: Appears within functional limits for tasks assessed/performed Arousal/Alertness: Awake/alert Orientation Level: Appears intact for tasks assessed Behavior During Session: [facility] for tasks performed    Balance     End of Session PT - End of Session Activity Tolerance: Patient tolerated treatment well Patient left: in chair;with call bell/phone within reach Nurse Communication: Mobility status     Verdell Face, [place] [phone] 02/19/2013

## 2013-02-19 NOTE — Progress Notes (Signed)
Subjective: 2 Days Post-Op Procedure(s) (LRB): OPEN REDUCTION INTERNAL FIXATION (ORIF) RIGHT TIBIAL PILON AND ORIF RIGHT FIBULA FRACTURE AND REMOVAL OF SYNTHESE EXFIX (Right) Patient reports pain as moderate.  Adequately controlled with oral pain meds.  Objective: Vital signs in last 24 hours: Temp:  [98.3 F (36.8 C)-99.9 F (37.7 C)] 98.3 F (36.8 C) (03/15 0556) Pulse Rate:  [88-100] 88 (03/15 0556) Resp:  [14-18] 18 (03/15 0556) BP: (124-146)/(76-93) 124/76 mmHg (03/15 0556) SpO2:  [91 %-97 %] 95 % (03/15 0556) FiO2 (%):  [91 %] 91 % (03/14 1218)  Intake/Output from previous day: 03/14 0701 - 03/15 0700 In: 2300 [P.O.:360; I.V.:1940] Out: 4725 [Urine:4725] Intake/Output this shift:     Recent Labs  02/16/13 2011 02/17/13 1808  HGB 10.0* 10.5*    Recent Labs  02/16/13 2011 02/17/13 1808  WBC 9.3 11.5*  RBC 3.29* 3.51*  HCT 28.3* 30.3*  PLT 386 483*    Recent Labs  02/16/13 2011 02/17/13 1808  NA 137  --   K 4.0  --   CL 98  --   CO2 30  --   BUN 7  --   CREATININE 0.66 0.79  GLUCOSE 93  --   CALCIUM 8.8  --    No results found for this basename: LABPT, INR,  in the last 72 hours  R LE splint in good shape.  NVI at R toes.  Assessment/Plan: 2 Days Post-Op Procedure(s) (LRB): OPEN REDUCTION INTERNAL FIXATION (ORIF) RIGHT TIBIAL PILON AND ORIF RIGHT FIBULA FRACTURE AND REMOVAL OF SYNTHESE EXFIX (Right)  OK for d/c home from my perspective at any time.  All ortho pertinent d/c instructions in Epic including rx for xarelto for two weeks post op.  [name] 02/19/2013, 9:50 AM

## 2013-02-19 NOTE — Progress Notes (Signed)
Pt for discharge to home accomp by wife.  Medication assistance by pharmacy given to pt and explained.  Rx given to pt and explained.  Rolling walker with pt for home use.  Pt instructed on what to call MD for, FU appt, wound care, equipment care, diet.

## 2013-02-21 ENCOUNTER — Telehealth (HOSPITAL_COMMUNITY): Payer: Self-pay | Admitting: Emergency Medicine

## 2013-02-21 ENCOUNTER — Encounter (HOSPITAL_COMMUNITY): Payer: Self-pay | Admitting: Orthopedic Surgery

## 2013-02-21 NOTE — Telephone Encounter (Signed)
Was already contacted by CM.

## 2013-02-21 NOTE — Progress Notes (Signed)
02/21/2013 1230 Wife had questions about HH PT/OT.  NCM contacted pt and requested call to wife # [phone]. Contacted number and left message with person answering phone. Isidoro Donning RN CCM Case Mgmt phone [phone]

## 2013-03-25 ENCOUNTER — Telehealth (HOSPITAL_COMMUNITY): Payer: Self-pay | Admitting: Emergency Medicine

## 2013-03-28 NOTE — Telephone Encounter (Signed)
Called on Friday because he needed a refill on pain meds and Dr. Laverta Baltimore office was closed. Answered some questions about pain management but he understands this needs to be addressed through Dr. Victorino Dike.

## 2013-10-08 ENCOUNTER — Encounter (HOSPITAL_BASED_OUTPATIENT_CLINIC_OR_DEPARTMENT_OTHER): Payer: Self-pay | Admitting: Emergency Medicine

## 2013-10-08 ENCOUNTER — Emergency Department (HOSPITAL_BASED_OUTPATIENT_CLINIC_OR_DEPARTMENT_OTHER)
Admission: EM | Admit: 2013-10-08 | Discharge: 2013-10-08 | Disposition: A | Discharge status: Home / Self Care | Payer: Medicaid Other | Attending: Emergency Medicine | Admitting: Emergency Medicine

## 2013-10-08 DIAGNOSIS — Z79899 Other long term (current) drug therapy: Secondary | ICD-10-CM | POA: Insufficient documentation

## 2013-10-08 DIAGNOSIS — K089 Disorder of teeth and supporting structures, unspecified: Secondary | ICD-10-CM | POA: Insufficient documentation

## 2013-10-08 DIAGNOSIS — K0889 Other specified disorders of teeth and supporting structures: Secondary | ICD-10-CM

## 2013-10-08 DIAGNOSIS — F17200 Nicotine dependence, unspecified, uncomplicated: Secondary | ICD-10-CM | POA: Insufficient documentation

## 2013-10-08 MED ORDER — AMOXICILLIN 500 MG PO CAPS: 500.0000 mg | ORAL_CAPSULE | Freq: Three times a day (TID) | ORAL | Status: DC

## 2013-10-08 MED ORDER — HYDROCODONE-ACETAMINOPHEN 5-325 MG PO TABS: 2.0000 | ORAL_TABLET | ORAL | Status: DC | PRN

## 2013-10-08 NOTE — Discharge Instructions (Signed)
Dental Pain  A tooth ache may be caused by cavities (tooth decay). Cavities expose the nerve of the tooth to air and hot or cold temperatures. It may come from an infection or abscess (also called a boil or furuncle) around your tooth. It is also often caused by dental caries (tooth decay). This causes the pain you are having.  DIAGNOSIS   Your caregiver can diagnose this problem by exam.  TREATMENT    If caused by an infection, it may be treated with medications which kill germs (antibiotics) and pain medications as prescribed by your caregiver. Take medications as directed.   Only take over-the-counter or prescription medicines for pain, discomfort, or fever as directed by your caregiver.   Whether the tooth ache today is caused by infection or dental disease, you should see your dentist as soon as possible for further care.  SEEK MEDICAL CARE IF:  The exam and treatment you received today has been provided on an emergency basis only. This is not a substitute for complete medical or dental care. If your problem worsens or new problems (symptoms) appear, and you are unable to meet with your dentist, call or return to this location.  SEEK IMMEDIATE MEDICAL CARE IF:    You have a fever.   You develop redness and swelling of your face, jaw, or neck.   You are unable to open your mouth.   You have severe pain uncontrolled by pain medicine.  MAKE SURE YOU:    Understand these instructions.   Will watch your condition.   Will get help right away if you are not doing well or get worse.  Document Released: 11/24/2005 Document Revised: 02/16/2012 Document Reviewed: 07/12/2008  ExitCare Patient Information 2014 [employer].

## 2013-10-08 NOTE — ED Notes (Signed)
Right side upper dental pain, patient states that it was swollen more earlier this morning. Patient does not have a Education officer, community.

## 2013-10-08 NOTE — ED Provider Notes (Signed)
Medical screening examination/treatment/procedure(s) were performed by non-physician practitioner and as supervising physician I was immediately available for consultation/collaboration.  EKG Interpretation   None        Jaslin Novitski, MD 10/08/13 1533 

## 2013-10-08 NOTE — ED Provider Notes (Signed)
CSN: [id number]     Arrival date & time 10/08/13  1155 History   First MD Initiated Contact with Patient 10/08/13 1208     Chief Complaint  Patient presents with  . Dental Pain   (Consider location/radiation/quality/duration/timing/severity/associated sxs/prior Treatment) Patient is a 37 y.o. male presenting with tooth pain. The history is provided by the patient. No language interpreter was used.  Dental Pain Location:  Upper Upper teeth location:  3/RU 1st molar Quality:  Dull Severity:  Mild Onset quality:  Sudden Timing:  Constant Progression:  Worsening Chronicity:  New Previous work-up:  Dental exam Relieved by:  Nothing Worsened by:  Nothing tried Ineffective treatments:  None tried Pt complains of swelling around tooth  Past Medical History  Diagnosis Date  . Medical history non-contributory    Past Surgical History  Procedure Laterality Date  . Colon surgery    . Laparotomy N/A 02/09/2013    Procedure: EXPLORATORY LAPAROTOMY with small bowel resection and repair of mesentery;  Surgeon: Cherylynn Ridges, MD;  Location: [facility] OR;  Service: General;  Laterality: N/A;  . External fixation leg Right 02/09/2013    Procedure: EXTERNAL FIXATION LEG;  Surgeon: Eulas Post, MD;  Location: [facility] OR;  Service: Orthopedics;  Laterality: Right;  . Open reduction internal fixation (orif) tibia/fibula fracture Right 02/17/2013    Procedure: OPEN REDUCTION INTERNAL FIXATION (ORIF) RIGHT TIBIAL PILON AND ORIF RIGHT FIBULA FRACTURE AND REMOVAL OF SYNTHESE EXFIX;  Surgeon: [name], MD;  Location: [facility] OR;  Service: Orthopedics;  Laterality: Right;   No family history on file. History  Substance Use Topics  . Smoking status: Current Every Day Smoker    Types: Cigarettes  . Smokeless tobacco: Not on file  . Alcohol Use: Yes    Review of Systems  All other systems reviewed and are negative.    Allergies  Review of patient's allergies indicates no known allergies.  Home Medications    Current Outpatient Rx  Name  Route  Sig  Dispense  Refill  . docusate sodium 100 MG CAPS   Oral   Take 100 mg by mouth 2 (two) times daily as needed.   10 capsule      . OxyCODONE (OXYCONTIN) 80 mg T12A   Oral   Take 1 tablet (80 mg total) by mouth every 12 (twelve) hours.   10 tablet   0   . OxyCODONE 60 MG T12A   Oral   Take 60 mg by mouth every 12 (twelve) hours. Do not fill before 02/23/13   10 tablet   0   . oxyCODONE-acetaminophen (PERCOCET) 10-325 MG per tablet   Oral   Take 1-2 tablets by mouth every 4 (four) hours as needed for pain.   60 tablet   0   . polyethylene glycol powder (GLYCOLAX/MIRALAX) powder   Oral   Take 17 g by mouth daily.         . rivaroxaban (XARELTO) 10 MG TABS tablet   Oral   Take 1 tablet (10 mg total) by mouth daily.   14 tablet   0   . traMADol (ULTRAM) 50 MG tablet   Oral   Take 2 tablets (100 mg total) by mouth every 6 (six) hours.   100 tablet   1    BP 156/87  Pulse 75  Temp(Src) 97.6 F (36.4 C) (Oral)  Resp 15  Ht 6' (1.829 m)  Wt 185 lb (83.915 kg)  BMI 25.08 kg/m2  SpO2 99% Physical  Exam  Constitutional: He appears well-developed.  HENT:  Head: Normocephalic.  Swollen gum,   Eyes: Pupils are equal, round, and reactive to light.  Neck: Normal range of motion.  Cardiovascular: Normal rate.   Pulmonary/Chest: Effort normal.  Musculoskeletal: Normal range of motion.  Neurological: He is alert.  Skin: Skin is warm.  Psychiatric: He has a normal mood and affect.    ED Course  Procedures (including critical care time) Labs Review Labs Reviewed - No data to display Imaging Review No results found.  EKG Interpretation   None       MDM   1. Toothache   amoxicillian and hydrocodone    Elson Areas, [place] 10/08/13 1244

## 2014-02-28 IMAGING — CT CT CHEST W/ CM
4 of 5 series · 12 of 46 positions shown, 19 images · IV contrast (omnipaque)
Comparison: None.

CT CHEST

CLINICAL DATA: Crush injury while trimming trees.

CT CHEST, ABDOMEN AND PELVIS WITH CONTRAST
TECHNIQUE: Multidetector CT imaging of the chest, abdomen and
pelvis was performed following the standard protocol during bolus
administration of intravenous contrast.
Contrast: 100mL OMNIPAQUE IOHEXOL 300 MG/ML  SOLN the

[Series 2: chest/abd/pelvis · axial · 0.97mm/px · z∈[-637,-397]mm · 4 of 138 slices shown]
[im 17/138  soft-tissue]
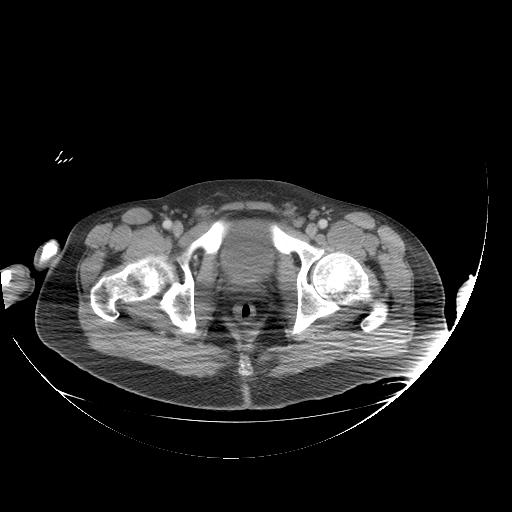
[im 33/138  soft-tissue]
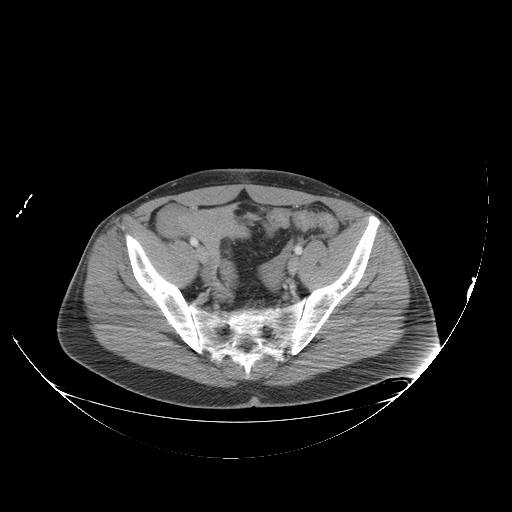
[im 49/138  soft-tissue]
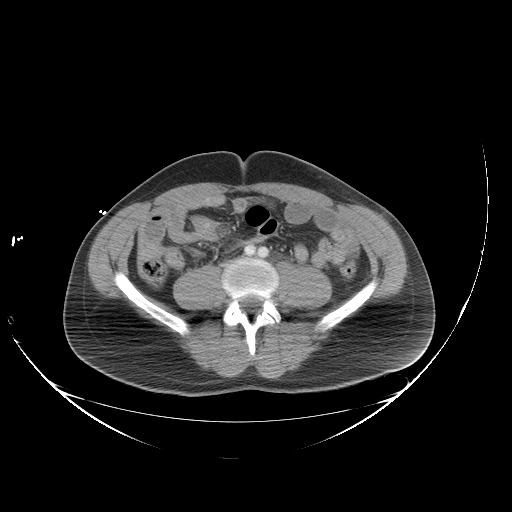
[im 65/138  soft-tissue]
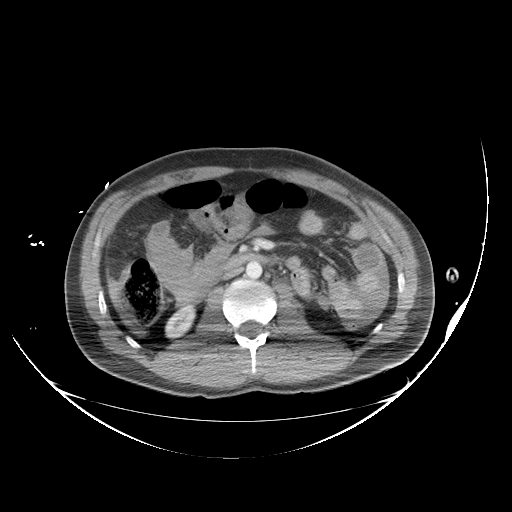

[Series 5: renal delays · axial · 0.98mm/px · z∈[-459,-309]mm · 4 of 50 slices shown, 9 images]
[im 10/50  soft-tissue]
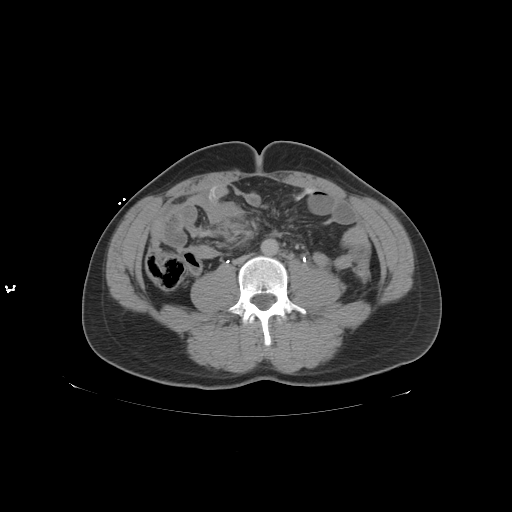
[im 10/50  lung]
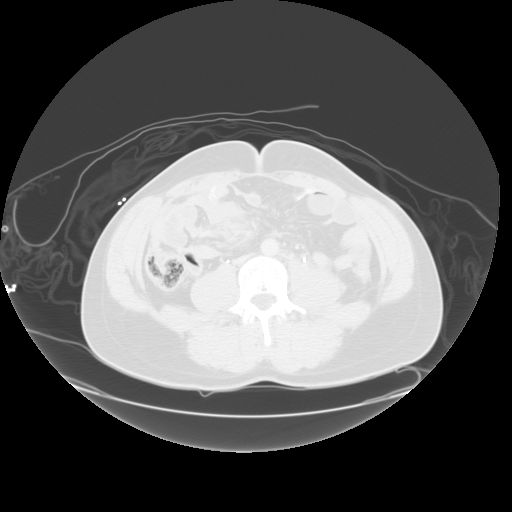
[im 10/50  bone]
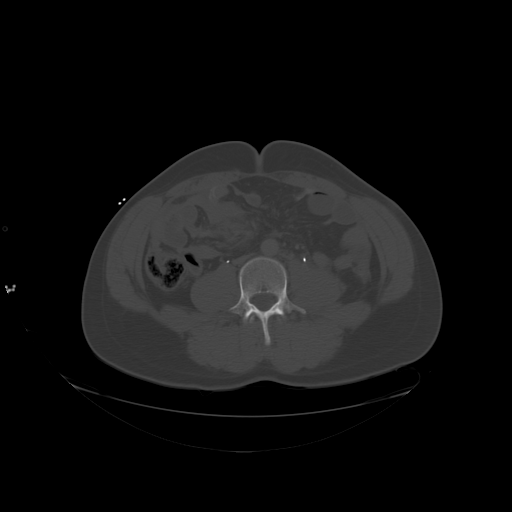
[im 20/50  soft-tissue]
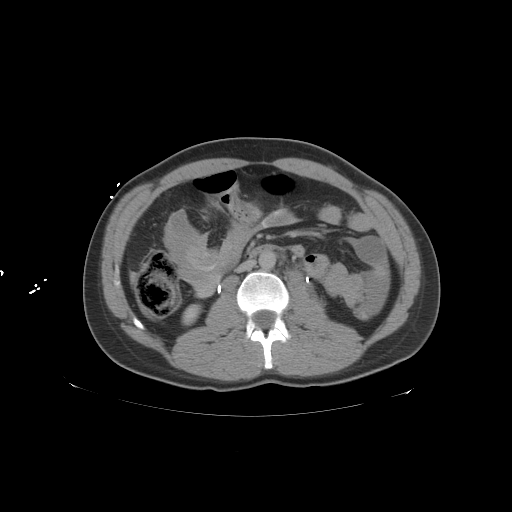
[im 20/50  lung]
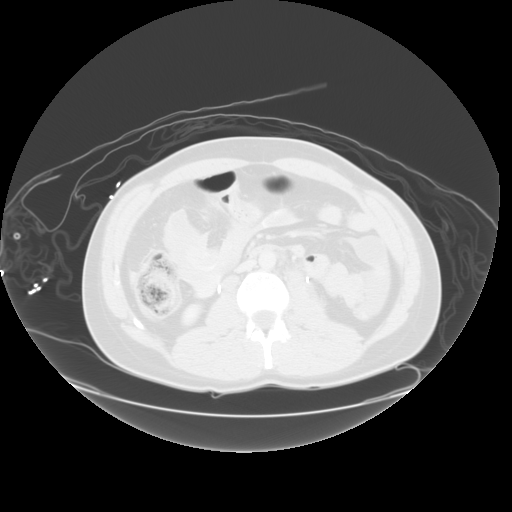
[im 30/50  soft-tissue]
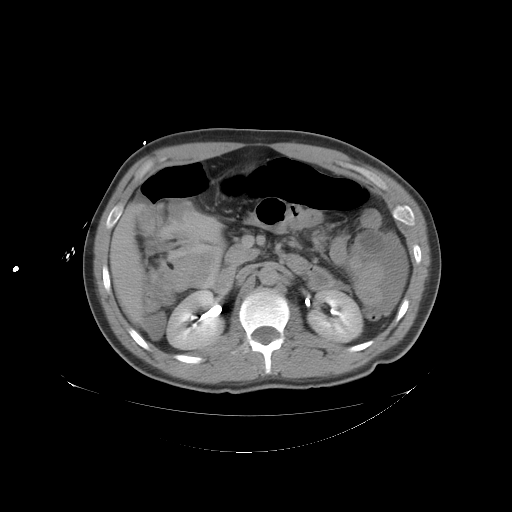
[im 30/50  lung]
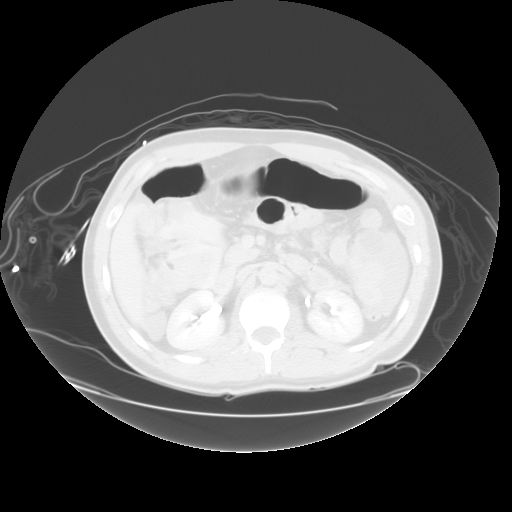
[im 40/50  soft-tissue]
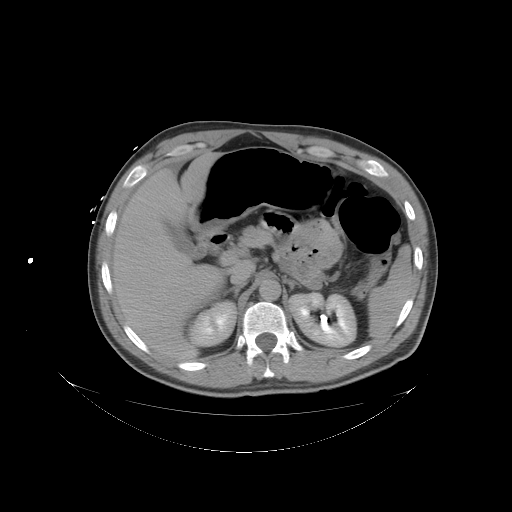
[im 40/50  lung]
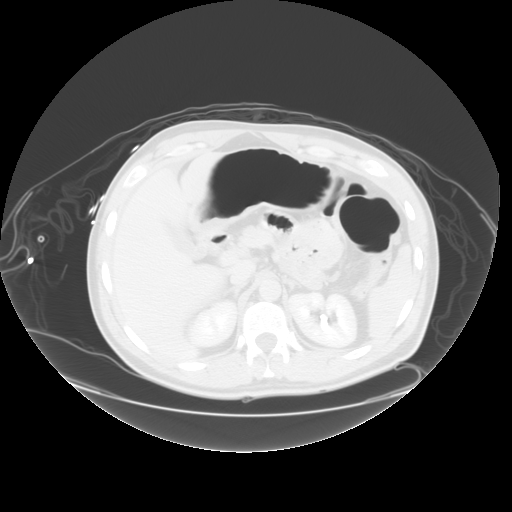

[Series 400: sagittal · sagittal · 1.37mm/px · 1 of 131 slices shown, 2 images]
[im 44/131  soft-tissue]
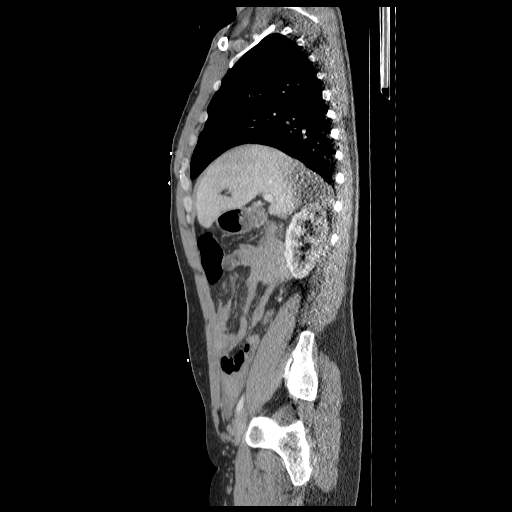
[im 44/131  bone]
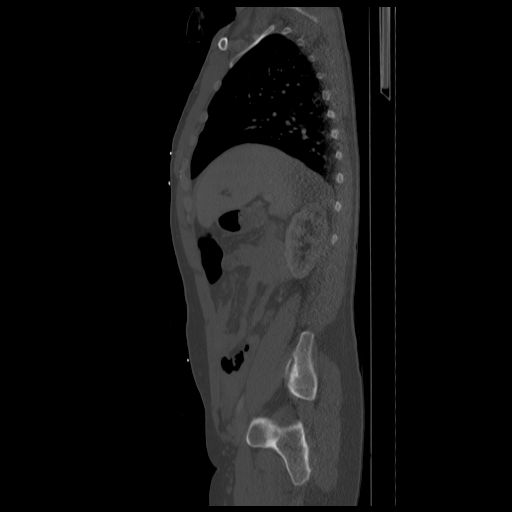

[Series 401: coronal · coronal · 1.37mm/px · 3 of 98 slices shown, 4 images]
[im 33/98  soft-tissue]
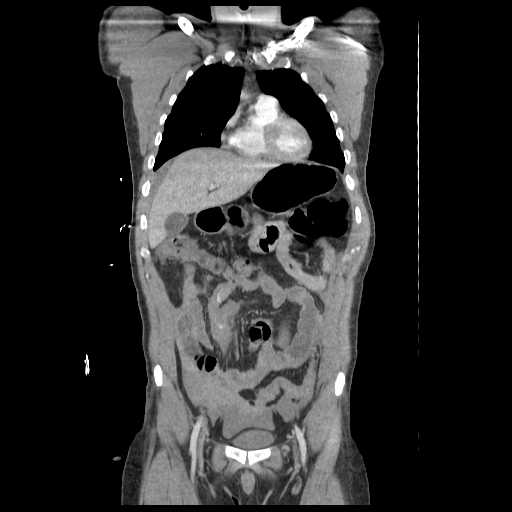
[im 44/98  soft-tissue]
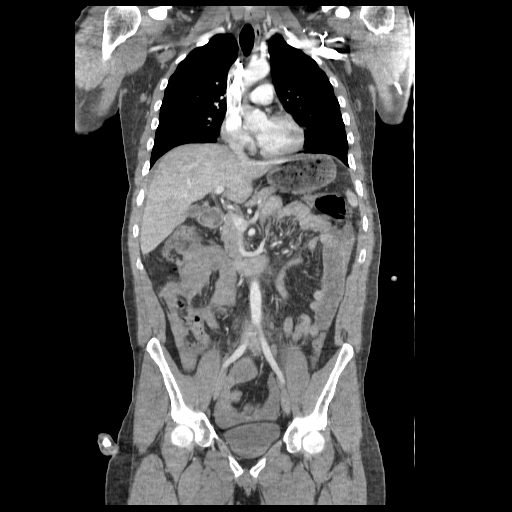
[im 44/98  bone]
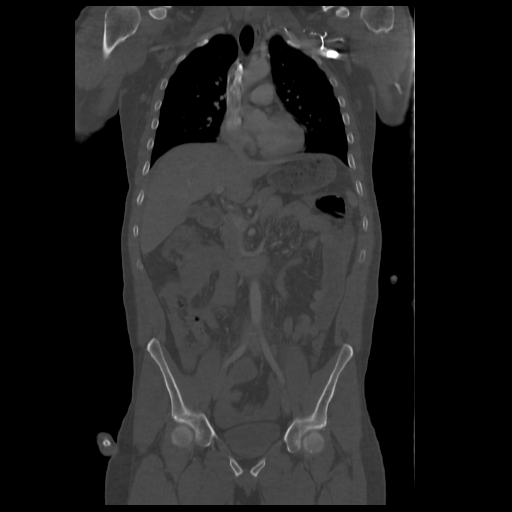
[im 54/98  soft-tissue]
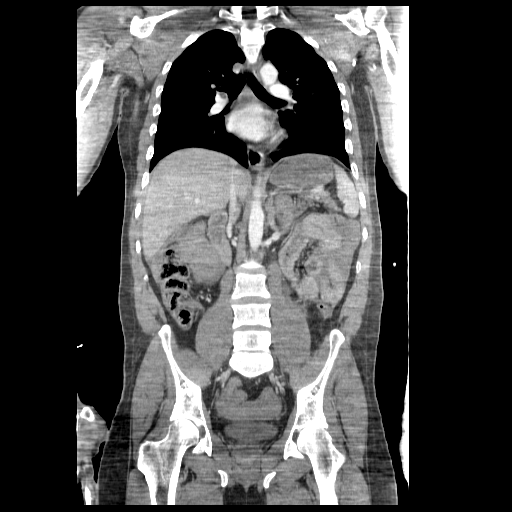

[12 of 46 positions shown; findings below may reference images not displayed]

FINDINGS: No pneumothorax.  No mediastinal hematoma.  No pleural
or pericardial effusion.  Mild dependent atelectasis posteriorly in
both lower lobes.  Lungs otherwise clear.  Normal vascular
enhancement.  Thoracic spine and sternum intact.
IMPRESSION: 1.  No acute thoracic process

CT ABDOMEN AND PELVIS
FINDINGS: There is high attenuation   perihepatic fluid as well as
fluid in the pericolic gutters left greater than right and in the
cul-de-sac.  There is active extravasation in the anterior
peritoneal cavity probably from proximal SMA branch on image 88/2.
Delayed scans show progressive contrast extravasation.

The stomach is physiologically distended.  Small bowel is
nondilated.  There is no definite bowel wall thickening, assessment
limited without any oral contrast.  The colon is decompressed.
Urinary bladder incompletely distended.  There is no free air
identified.  No adenopathy.  Lumbar spine intact.  Bony pelvis
intact.

Unremarkable liver, spleen, kidneys, adrenal glands, pancreas.
Portal vein is patent.
IMPRESSION: 1.  Active extravasation from a branch of the SMA in the right mid
abdomen, with hemorrhagic ascites.
I discussed the critical test results over the telephone with Dr.
LAKIA at the time of interpretation.

## 2014-02-28 IMAGING — CR DG ANKLE PORT 2V*R*
2 series · 2 of 2 positions shown · non-contrast
Comparison: None.

CLINICAL DATA: Level I trauma.  Ankle deformity

PORTABLE RIGHT ANKLE - 2 VIEW

[AP]
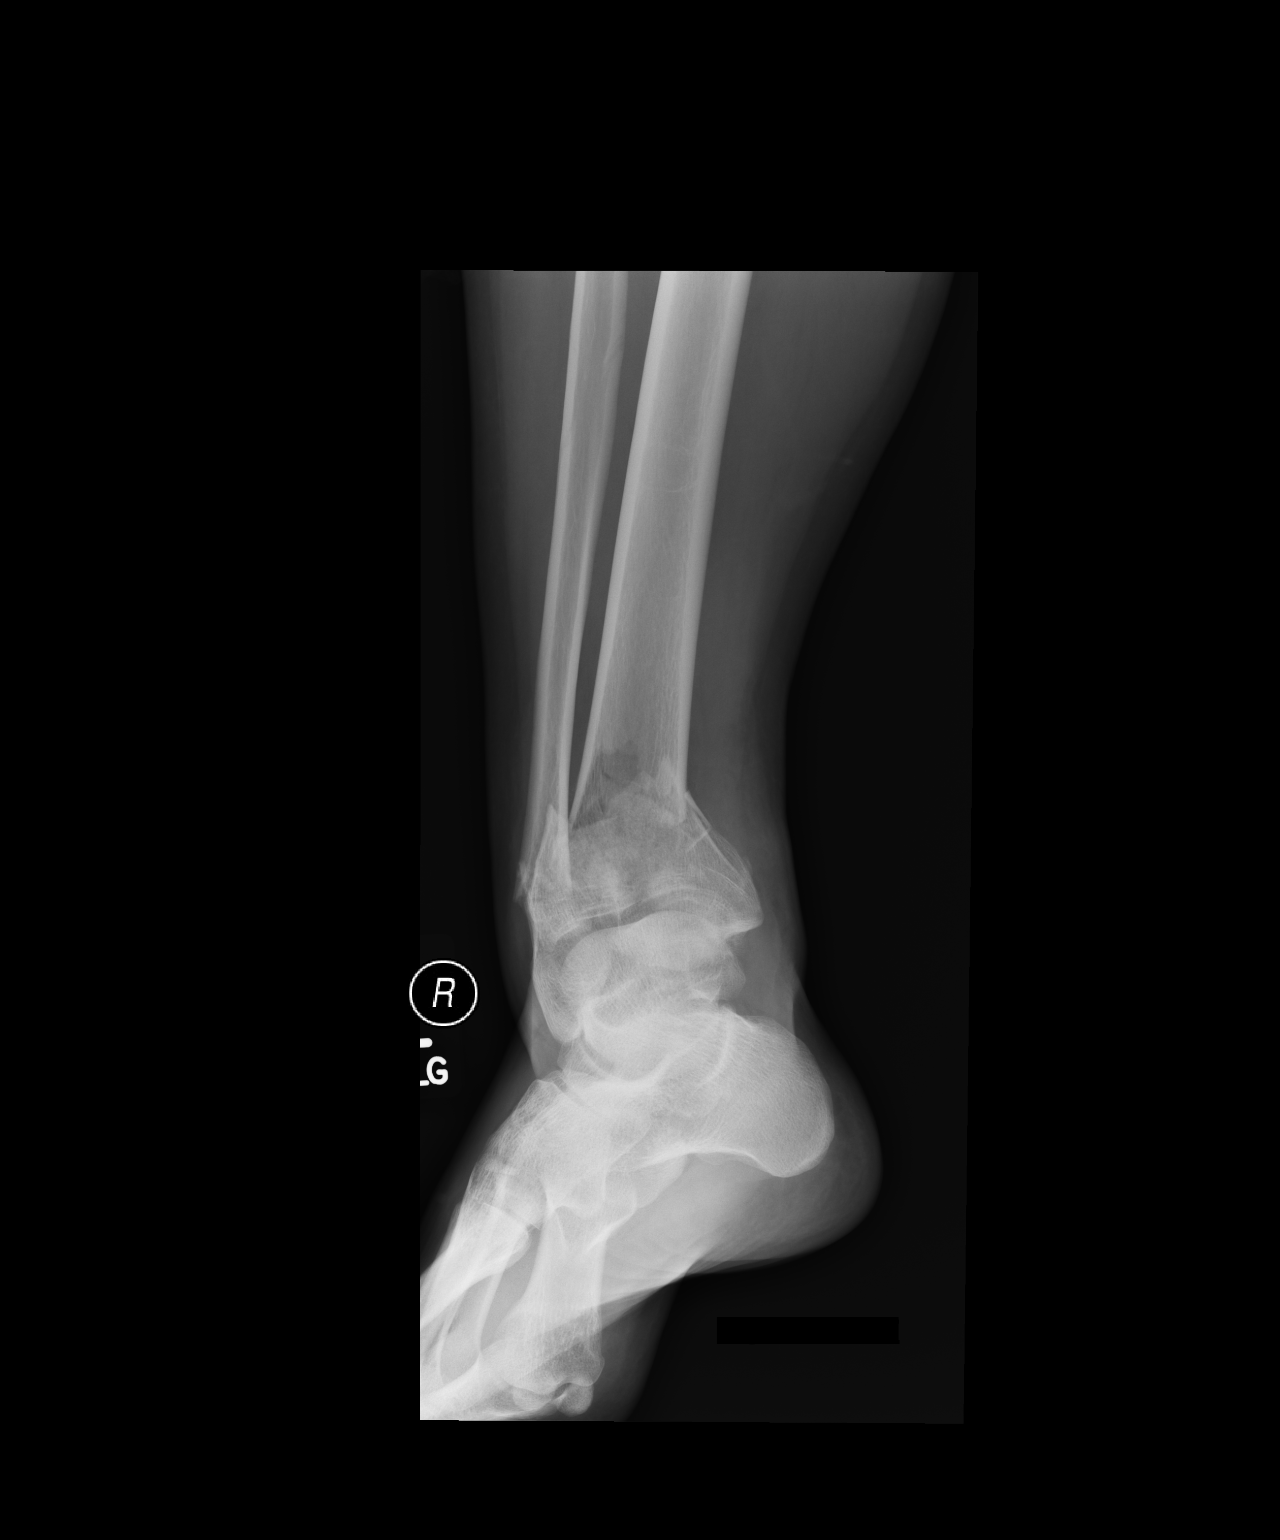

[lateral]
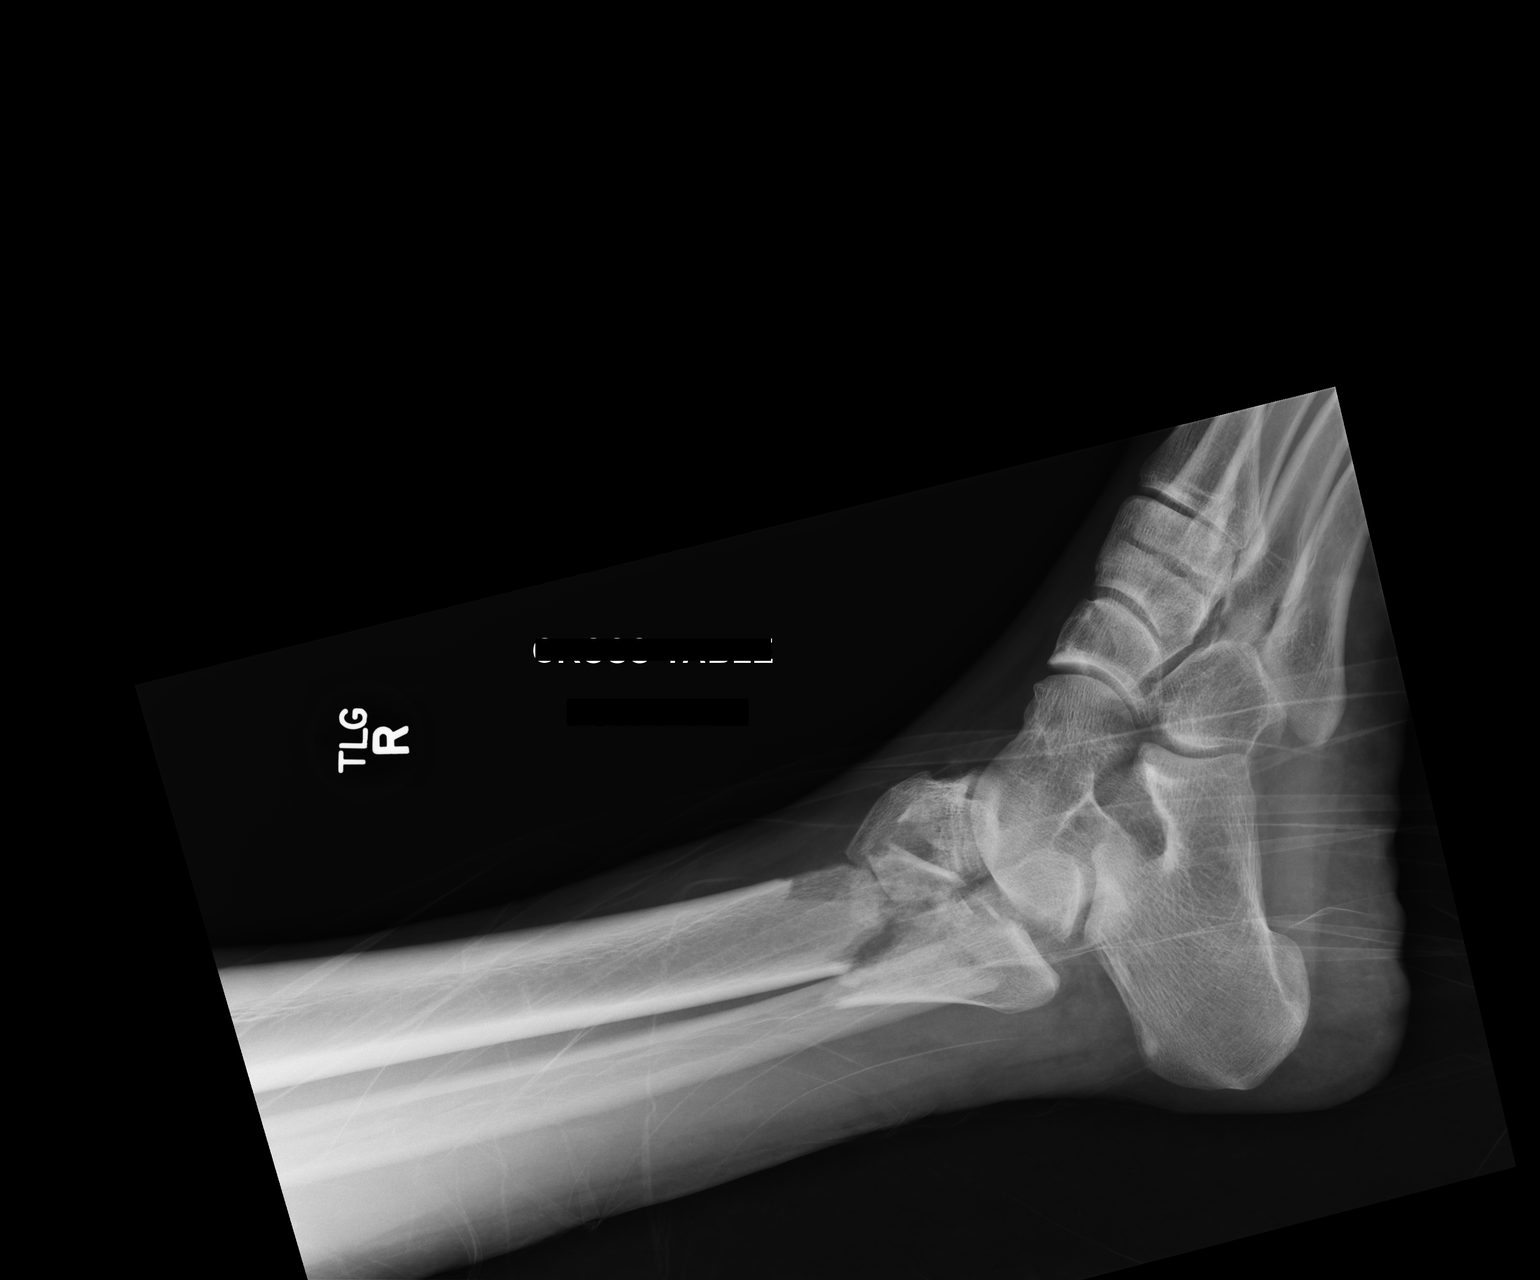

[2 of 2 positions shown; findings below may reference images not displayed]

FINDINGS: There are two portable views of the right ankle provided.
There is a severely comminuted fracture of the distal tibial
metaphysis which extends to the articular surface.  There is a
fracture of the distal fibula.  On the AP view there appears to be
malalignment of the calcaneus with the talus.  Suspicion for
dislocation.
IMPRESSION: Severely comminuted intra-articular fracture of the distal tibia.

Fracture of the distal fibula.

Concern for dislocation.  Limited views.

## 2014-03-08 IMAGING — RF DG C-ARM GT 120 MIN
1 series · 3 of 3 positions shown · non-contrast
Comparison: CT 02/10/2013.

CLINICAL DATA: Right ankle fracture.  ORIF.

DG C-ARM GT 120 MIN,RIGHT TIBIA AND FIBULA - 2 VIEW

[Series 1: run · 3 of 3 slices shown]
[im 1/3]
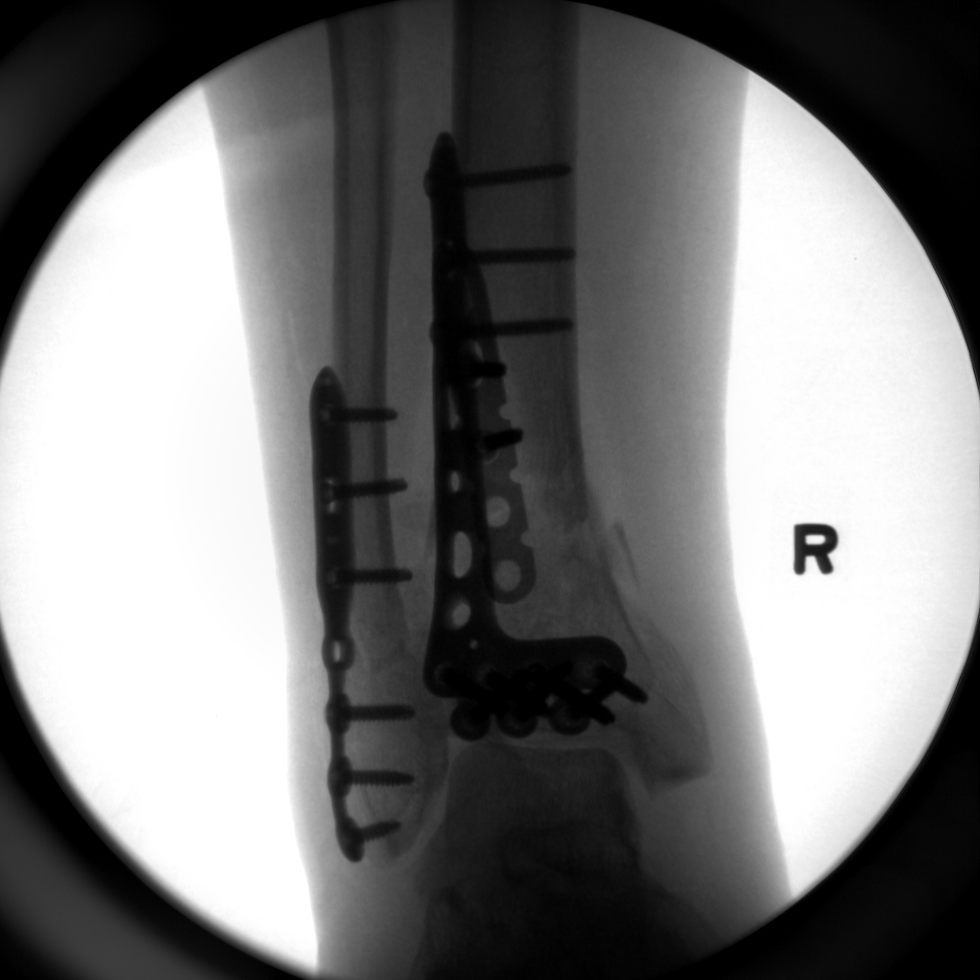
[im 2/3]
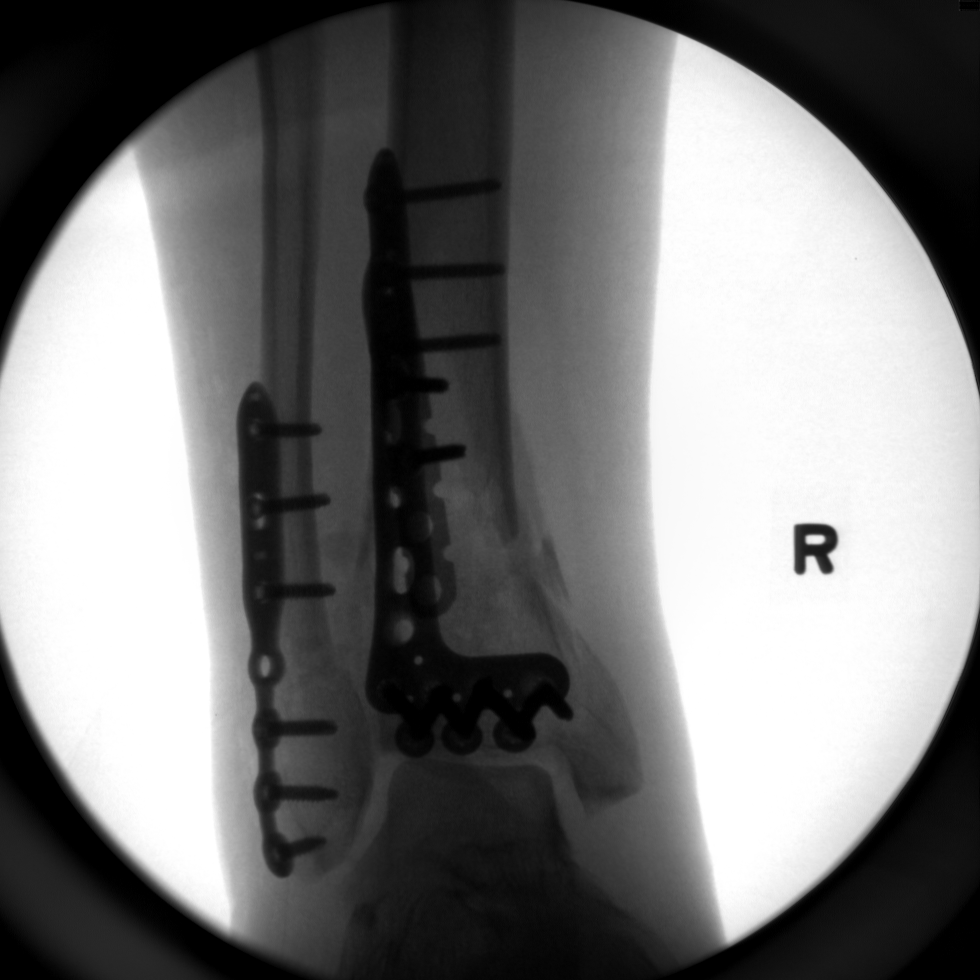
[im 3/3]
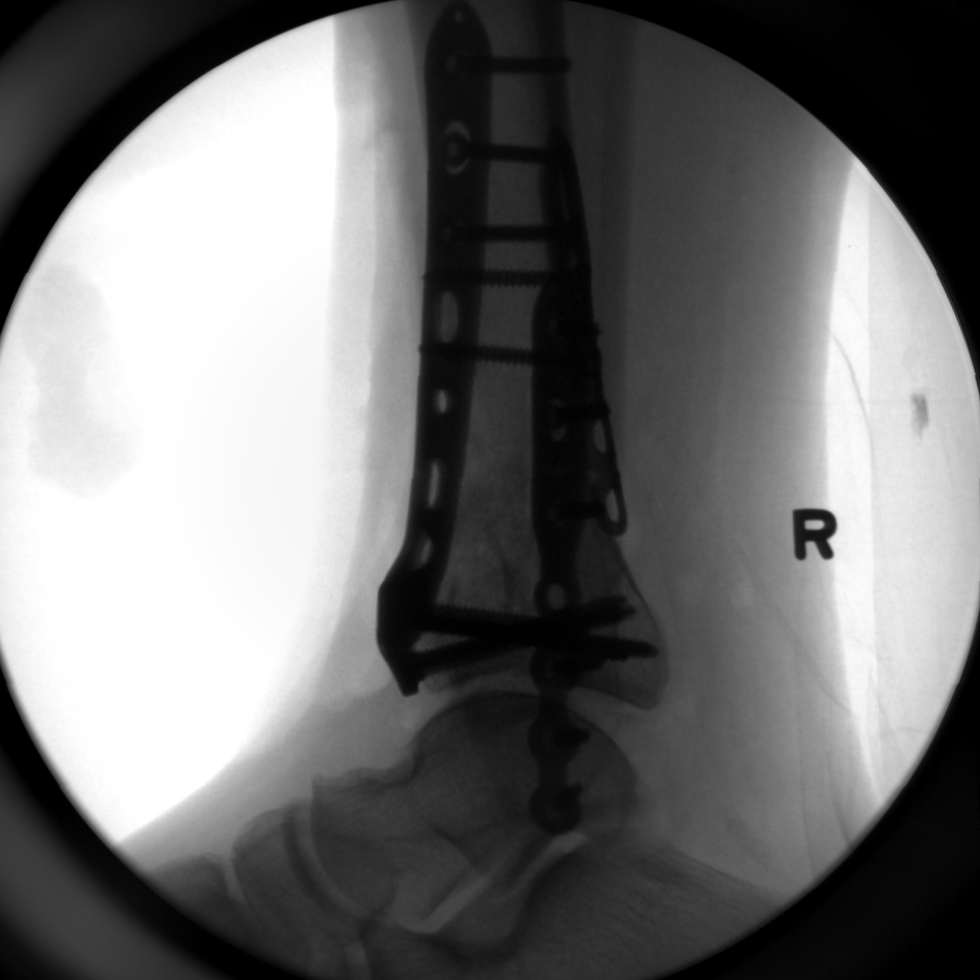

[3 of 3 positions shown; findings below may reference images not displayed]

FINDINGS: Lateral fibular and anterior and posterior tibial plates
and screws have been applied to the comminuted fractures.  There is
near anatomic reduction of the tibial and fibular fractures.  There
is no residual widening of the ankle mortise.
IMPRESSION: Near anatomic reduction of tibial and fibular fractures status post
ORIF.

## 2014-12-17 ENCOUNTER — Emergency Department (HOSPITAL_BASED_OUTPATIENT_CLINIC_OR_DEPARTMENT_OTHER)
Admission: EM | Admit: 2014-12-17 | Discharge: 2014-12-17 | Disposition: A | Discharge status: Home / Self Care | Payer: Medicaid Other | Attending: Emergency Medicine | Admitting: Emergency Medicine

## 2014-12-17 ENCOUNTER — Encounter (HOSPITAL_BASED_OUTPATIENT_CLINIC_OR_DEPARTMENT_OTHER): Payer: Self-pay | Admitting: Emergency Medicine

## 2014-12-17 DIAGNOSIS — Z792 Long term (current) use of antibiotics: Secondary | ICD-10-CM | POA: Diagnosis not present

## 2014-12-17 DIAGNOSIS — Z720 Tobacco use: Secondary | ICD-10-CM | POA: Diagnosis not present

## 2014-12-17 DIAGNOSIS — Z7901 Long term (current) use of anticoagulants: Secondary | ICD-10-CM | POA: Diagnosis not present

## 2014-12-17 DIAGNOSIS — K644 Residual hemorrhoidal skin tags: Secondary | ICD-10-CM | POA: Insufficient documentation

## 2014-12-17 DIAGNOSIS — K6289 Other specified diseases of anus and rectum: Secondary | ICD-10-CM | POA: Diagnosis present

## 2014-12-17 DIAGNOSIS — Z79899 Other long term (current) drug therapy: Secondary | ICD-10-CM | POA: Insufficient documentation

## 2014-12-17 MED ORDER — IBUPROFEN 800 MG PO TABS
800.0000 mg | ORAL_TABLET | Freq: Once | ORAL | Status: AC
  Administered 2014-12-17: 800 mg via ORAL
  Filled 2014-12-17: qty 1

## 2014-12-17 MED ORDER — ACETAMINOPHEN 500 MG PO TABS
1000.0000 mg | ORAL_TABLET | Freq: Once | ORAL | Status: AC
  Administered 2014-12-17: 1000 mg via ORAL
  Filled 2014-12-17: qty 2

## 2014-12-17 MED ORDER — HYDROCORTISONE 2.5 % RE CREA: TOPICAL_CREAM | RECTAL | Status: AC

## 2014-12-17 MED ORDER — DOCUSATE SODIUM 100 MG PO CAPS
100.0000 mg | ORAL_CAPSULE | Freq: Once | ORAL | Status: AC
  Administered 2014-12-17: 100 mg via ORAL
  Filled 2014-12-17: qty 1

## 2014-12-17 MED ORDER — SENNOSIDES-DOCUSATE SODIUM 8.6-50 MG PO TABS: 1.0000 | ORAL_TABLET | Freq: Every day | ORAL | Status: DC

## 2014-12-17 NOTE — ED Notes (Signed)
Pt presents to ED with complaints of hemorrhoids like pain.

## 2014-12-17 NOTE — ED Provider Notes (Signed)
CSN: [id number]     Arrival date & time 12/17/14  1117 History   First MD Initiated Contact with Patient 12/17/14 1206     Chief Complaint  Patient presents with  . Hemorrhoids     (Consider location/radiation/quality/duration/timing/severity/associated sxs/prior Treatment) HPI   [name] is a 39 y.o. male complaining of rectal discomfort and blood on toilet paper which he noticed 6 days ago, patient states that the pain has become more severe pain is exacerbated with bowel movements & palpation. States that he had his girlfriend look at this area, she set up a mirror so he can see states that he has a hemorrhoid the lentgth of his little finger. Rates his pain at 8 out of 10 and says it's exacerbated by bowel movements. No prior history of hemorrhoids. Patient works as an Haematologist   Past Medical History  Diagnosis Date  . Medical history non-contributory    Past Surgical History  Procedure Laterality Date  . Colon surgery    . Laparotomy N/A 02/09/2013    Procedure: EXPLORATORY LAPAROTOMY with small bowel resection and repair of mesentery;  Surgeon: Cherylynn Ridges, MD;  Location: [facility] OR;  Service: General;  Laterality: N/A;  . External fixation leg Right 02/09/2013    Procedure: EXTERNAL FIXATION LEG;  Surgeon: Eulas Post, MD;  Location: [facility] OR;  Service: Orthopedics;  Laterality: Right;  . Open reduction internal fixation (orif) tibia/fibula fracture Right 02/17/2013    Procedure: OPEN REDUCTION INTERNAL FIXATION (ORIF) RIGHT TIBIAL PILON AND ORIF RIGHT FIBULA FRACTURE AND REMOVAL OF SYNTHESE EXFIX;  Surgeon: [name], MD;  Location: [facility] OR;  Service: Orthopedics;  Laterality: Right;   No family history on file. History  Substance Use Topics  . Smoking status: Current Every Day Smoker    Types: Cigarettes  . Smokeless tobacco: Not on file  . Alcohol Use: Yes    Review of Systems  10 systems reviewed and found to be negative, except as noted in the HPI.   Allergies    Review of patient's allergies indicates no known allergies.  Home Medications   Prior to Admission medications   Medication Sig Start Date End Date Taking? Authorizing Provider  amoxicillin (AMOXIL) 500 MG capsule Take 1 capsule (500 mg total) by mouth 3 (three) times daily. 10/08/13   Elson Areas, PA-C  docusate sodium 100 MG CAPS Take 100 mg by mouth 2 (two) times daily as needed. 02/19/13   Freeman Caldron, PA-C  HYDROcodone-acetaminophen (NORCO/VICODIN) 5-325 MG per tablet Take 2 tablets by mouth every 4 (four) hours as needed. 10/08/13   Elson Areas, PA-C  OxyCODONE (OXYCONTIN) 80 mg T12A Take 1 tablet (80 mg total) by mouth every 12 (twelve) hours. 02/19/13   Freeman Caldron, PA-C  OxyCODONE 60 MG T12A Take 60 mg by mouth every 12 (twelve) hours. Do not fill before 02/23/13 02/19/13   Freeman Caldron, PA-C  polyethylene glycol powder (GLYCOLAX/MIRALAX) powder Take 17 g by mouth daily. 02/19/13   Freeman Caldron, PA-C  rivaroxaban (XARELTO) 10 MG TABS tablet Take 1 tablet (10 mg total) by mouth daily. 02/18/13   [name], MD  traMADol (ULTRAM) 50 MG tablet Take 2 tablets (100 mg total) by mouth every 6 (six) hours. 02/19/13   Freeman Caldron, PA-C   BP 112/72 mmHg  Pulse 104  Temp(Src) 98.1 F (36.7 C) (Oral)  Resp 18  Ht  (1.803 m)  Wt 180 lb (81.647 kg)  BMI  25.12 kg/m2  SpO2 97% Physical Exam  Constitutional: He is oriented to person, place, and time. He appears well-developed and well-nourished. No distress.  HENT:  Head: Normocephalic.  Eyes: Conjunctivae and EOM are normal.  Cardiovascular: Normal rate.   Pulmonary/Chest: Effort normal. No stridor.  Genitourinary:     Rectal exam is chaperoned by ENT Reed, digital rectal exam with no significant internal hemorrhoids appreciated: Patient tolerated well.  Musculoskeletal: Normal range of motion.  Neurological: He is alert and oriented to person, place, and time.  Psychiatric: He has a normal mood and  affect.  Nursing note and vitals reviewed.   ED Course  Procedures (including critical care time) Labs Review Labs Reviewed - No data to display  Imaging Review No results found.   EKG Interpretation None      MDM   Final diagnoses:  External hemorrhoids without complication   Filed Vitals:   12/17/14 1123  BP: 112/72  Pulse: 104  Temp: 98.1 F (36.7 C)  TempSrc: Oral  Resp: 18  Height: 5\' 11"  (1.803 m)  Weight: 180 lb (81.647 kg)  SpO2: 97%    Medications  ibuprofen (ADVIL,MOTRIN) tablet 800 mg (800 mg Oral Given 12/17/14 1249)  acetaminophen (TYLENOL) tablet 1,000 mg (1,000 mg Oral Given 12/17/14 1249)  docusate sodium (COLACE) capsule 100 mg (100 mg Oral Given 12/17/14 1249)    [name] is a pleasant 39 y.o. male presenting with painful hemorrhoid, no signs of thrombosis or infection. Recommend sitz baths, Anusol, general surgery follow-up when necessary. Explained to patient that because he has Medicaid he will need a referral to the general surgeon to his primary care physician explained to him or he can find his primary care physician on his Medicaid card. Return precautions are discussed.  Evaluation does not show pathology that would require ongoing emergent intervention or inpatient treatment. Pt is hemodynamically stable and mentating appropriately. Discussed findings and plan with patient/guardian, who agrees with care plan. All questions answered. Return precautions discussed and outpatient follow up given.   New Prescriptions   HYDROCORTISONE (ANUSOL-HC) 2.5 % RECTAL CREAM    Apply rectally 2 times daily   SENNA-DOCUSATE (SENOKOT-S) 8.6-50 MG PER TABLET    Take 1 tablet by mouth at bedtime.       Wynetta Emeryicole Fay Bagg, PA-C 12/17/14 1252  Destry Dauber, PA-C 12/17/14 1559  Hilario Quarryanielle S Ray, MD 12/17/14 [phone]

## 2014-12-17 NOTE — Discharge Instructions (Signed)
For pain control you may take:   of ibuprofen (that is usually 4 over the counter pills)  3 times a day (take with food) and acetaminophen  (this is 3 over the counter pills) four times a day. Do not drink alcohol or combine with other medications that have acetaminophen as an ingredient (Read the labels!).    Please follow with your primary care doctor in the next 2 days for a check-up. They must obtain records for further management.   Do not hesitate to return to the Emergency Department for any new, worsening or concerning symptoms.     Hemorrhoids Hemorrhoids are swollen veins around the rectum or anus. There are two types of hemorrhoids:   Internal hemorrhoids. These occur in the veins just inside the rectum. They may poke through to the outside and become irritated and painful.  External hemorrhoids. These occur in the veins outside the anus and can be felt as a painful swelling or hard lump near the anus. CAUSES  Pregnancy.   Obesity.   Constipation or diarrhea.   Straining to have a bowel movement.   Sitting for long periods on the toilet.  Heavy lifting or other activity that caused you to strain.  Anal intercourse. SYMPTOMS   Pain.   Anal itching or irritation.   Rectal bleeding.   Fecal leakage.   Anal swelling.   One or more lumps around the anus.  DIAGNOSIS  Your caregiver may be able to diagnose hemorrhoids by visual examination. Other examinations or tests that may be performed include:   Examination of the rectal area with a gloved hand (digital rectal exam).   Examination of anal canal using a small tube (scope).   A blood test if you have lost a significant amount of blood.  A test to look inside the colon (sigmoidoscopy or colonoscopy). TREATMENT Most hemorrhoids can be treated at home. However, if symptoms do not seem to be getting better or if you have a lot of rectal bleeding, your caregiver may perform a procedure to  help make the hemorrhoids get smaller or remove them completely. Possible treatments include:   Placing a rubber band at the base of the hemorrhoid to cut off the circulation (rubber band ligation).   Injecting a chemical to shrink the hemorrhoid (sclerotherapy).   Using a tool to burn the hemorrhoid (infrared light therapy).   Surgically removing the hemorrhoid (hemorrhoidectomy).   Stapling the hemorrhoid to block blood flow to the tissue (hemorrhoid stapling).  HOME CARE INSTRUCTIONS   Eat foods with fiber, such as whole grains, beans, nuts, fruits, and vegetables. Ask your doctor about taking products with added fiber in them (fibersupplements).  Increase fluid intake. Drink enough water and fluids to keep your urine clear or pale yellow.   Exercise regularly.   Go to the bathroom when you have the urge to have a bowel movement. Do not wait.   Avoid straining to have bowel movements.   Keep the anal area dry and clean. Use wet toilet paper or moist towelettes after a bowel movement.   Medicated creams and suppositories may be used or applied as directed.   Only take over-the-counter or prescription medicines as directed by your caregiver.   Take warm sitz baths for 15-20 minutes, 3-4 times a day to ease pain and discomfort.   Place ice packs on the hemorrhoids if they are tender and swollen. Using ice packs between sitz baths may be helpful.   Put ice in a  plastic bag.   Place a towel between your skin and the bag.   Leave the ice on for 15-20 minutes, 3-4 times a day.   Do not use a donut-shaped pillow or sit on the toilet for long periods. This increases blood pooling and pain.  SEEK MEDICAL CARE IF:  You have increasing pain and swelling that is not controlled by treatment or medicine.  You have uncontrolled bleeding.  You have difficulty or you are unable to have a bowel movement.  You have pain or inflammation outside the area of the  hemorrhoids. MAKE SURE YOU:  Understand these instructions.  Will watch your condition.  Will get help right away if you are not doing well or get worse. Document Released: 11/21/2000 Document Revised: 11/10/2012 Document Reviewed: 09/28/2012 [facility] Patient Information 2015 [employer] [place]. This information is not intended to replace advice given to you by your health care provider. Make sure you discuss any questions you have with your health care provider.

## 2017-07-20 ENCOUNTER — Encounter (HOSPITAL_COMMUNITY): Payer: Self-pay | Admitting: Emergency Medicine

## 2017-07-20 ENCOUNTER — Emergency Department (HOSPITAL_COMMUNITY)
Admission: EM | Admit: 2017-07-20 | Discharge: 2017-07-20 | Disposition: A | Discharge status: Home / Self Care | Payer: Self-pay | Attending: Emergency Medicine | Admitting: Emergency Medicine

## 2017-07-20 ENCOUNTER — Emergency Department (HOSPITAL_COMMUNITY): Payer: Self-pay

## 2017-07-20 DIAGNOSIS — G8929 Other chronic pain: Secondary | ICD-10-CM

## 2017-07-20 DIAGNOSIS — K0889 Other specified disorders of teeth and supporting structures: Secondary | ICD-10-CM | POA: Insufficient documentation

## 2017-07-20 DIAGNOSIS — F17210 Nicotine dependence, cigarettes, uncomplicated: Secondary | ICD-10-CM | POA: Insufficient documentation

## 2017-07-20 DIAGNOSIS — M25571 Pain in right ankle and joints of right foot: Secondary | ICD-10-CM | POA: Insufficient documentation

## 2017-07-20 DIAGNOSIS — Z79899 Other long term (current) drug therapy: Secondary | ICD-10-CM | POA: Insufficient documentation

## 2017-07-20 MED ORDER — PENICILLIN V POTASSIUM 250 MG PO TABS
500.0000 mg | ORAL_TABLET | Freq: Once | ORAL | Status: AC
  Administered 2017-07-20: 500 mg via ORAL
  Filled 2017-07-20: qty 2

## 2017-07-20 MED ORDER — HYDROCODONE-ACETAMINOPHEN 5-325 MG PO TABS
1.0000 | ORAL_TABLET | Freq: Once | ORAL | Status: AC
  Administered 2017-07-20: 1 via ORAL
  Filled 2017-07-20: qty 1

## 2017-07-20 MED ORDER — NYSTATIN 100000 UNIT/ML MT SUSP: 500000.0000 [IU] | Freq: Four times a day (QID) | OROMUCOSAL | 0 refills | Status: DC

## 2017-07-20 MED ORDER — PENICILLIN V POTASSIUM 500 MG PO TABS: 500.0000 mg | ORAL_TABLET | Freq: Four times a day (QID) | ORAL | 0 refills | Status: AC

## 2017-07-20 NOTE — ED Triage Notes (Signed)
Pt here for dental pain x 6 days

## 2017-07-20 NOTE — Discharge Instructions (Signed)
Please read attached information regarding your condition. Take penicillin 4 times daily for 1 week. Use nystatin swish and swallow as directed. Follow-up with dentist listed below for further evaluation. Return to ED for worsening pain, trouble breathing, trouble swallowing, lip swelling, throat swelling.

## 2017-07-20 NOTE — ED Provider Notes (Signed)
[facility]-EMERGENCY DEPT Provider Note   CSN: [id number] Arrival date & time: 07/20/17  0705     History   Chief Complaint Chief Complaint  Patient presents with  . Dental Pain  . Leg Pain    HPI Juan QuamDaniel Joles III is a 41 y.o. male.  HPI Patient presents to ED for evaluation of dental pain for the past week as well as mouth sores. He also reports right ankle pain for the past few days. Concerning his dental pain, he states that he has had some dental pain in the past. He reports pain along the left gumline. He states that he has several chipped teeth. He is unable to follow-up with dentist due to insurance issues. He has tried Tylenol and ibuprofen with no relief in symptoms. He reports some bleeding from several chipped teeth. He also reports several mouth sores. He denies any neck pain, trouble moving neck, trouble breathing, trouble swallowing, rashes. Concerning his right ankle pain, he states that he had surgery done for tib-fib fracture on the right side 4 years ago. He states that pain intermittently happens. He states that he works in the [employer] and constantly twists and moves his ankle. He reports pain worse with rest. He denies any numbness, weakness, recent falls, color or temperature change in joint. He states that the ankle will swell every now and then.   Past Medical History:  Diagnosis Date  . Medical history non-contributory     Patient Active Problem List   Diagnosis Date Noted  . Pneumonia 02/17/2013  . Blunt abdominal trauma 02/10/2013  . Injury of mesentery 02/10/2013  . Acute blood loss anemia 02/10/2013  . Closed right pilon fracture 02/10/2013    Past Surgical History:  Procedure Laterality Date  . COLON SURGERY    . EXTERNAL FIXATION LEG Right 02/09/2013   Procedure: EXTERNAL FIXATION LEG;  Surgeon: Eulas PostJoshua P Landau, MD;  Location: [facility] OR;  Service: Orthopedics;  Laterality: Right;  . LAPAROTOMY N/A 02/09/2013   Procedure: EXPLORATORY  LAPAROTOMY with small bowel resection and repair of mesentery;  Surgeon: Cherylynn RidgesJames O Wyatt, MD;  [facility];  Service: General;  Laterality: N/A;  . OPEN REDUCTION INTERNAL FIXATION (ORIF) TIBIA/FIBULA FRACTURE Right 02/17/2013   Procedure: OPEN REDUCTION INTERNAL FIXATION (ORIF) RIGHT TIBIAL PILON AND ORIF RIGHT FIBULA FRACTURE AND REMOVAL OF SYNTHESE EXFIX;  Surgeon: Toni ArthursJohn Hewitt, MD;  Location: [facility] OR;  Service: Orthopedics;  Laterality: Right;       Home Medications    Prior to Admission medications   Medication Sig Start Date End Date Taking? Authorizing Provider  acetaminophen (TYLENOL) 500 MG tablet Take 1,500-2,000 mg by mouth every 6 (six) hours as needed for moderate pain.   Yes [provider]  naproxen sodium (ANAPROX) 220 MG tablet Take 440 mg by mouth 2 (two) times daily as needed (pain).   Yes [provider]  docusate sodium 100 MG CAPS Take 100 mg by mouth 2 (two) times daily as needed. Patient not taking: Reported on 07/20/2017 02/19/13   Freeman CaldronJeffery, Michael J, PA-C  HYDROcodone-acetaminophen (NORCO/VICODIN) 5-325 MG per tablet Take 2 tablets by mouth every 4 (four) hours as needed. Patient not taking: Reported on 07/20/2017 10/08/13   Elson AreasSofia, Leslie K, PA-C  hydrocortisone (ANUSOL-HC) 2.5 % rectal cream Apply rectally 2 times daily Patient not taking: Reported on 07/20/2017 12/17/14   Pisciotta, Joni ReiningNicole, PA-C  nystatin (MYCOSTATIN) 100000 UNIT/ML suspension Take 5 mLs (500,000 Units total) by mouth 4 (four) times daily. 07/20/17  Blease Capaldi, PA-C  OxyCODONE (OXYCONTIN) 80 mg T12A Take 1 tablet (80 mg total) by mouth every 12 (twelve) hours. Patient not taking: Reported on 07/20/2017 02/19/13   Freeman Caldron, PA-C  OxyCODONE 60 MG T12A Take 60 mg by mouth every 12 (twelve) hours. Do not fill before 02/23/13 Patient not taking: Reported on 07/20/2017 02/19/13   Freeman Caldron, PA-C  penicillin v potassium (VEETID) 500 MG tablet Take 1 tablet (500 mg total) by  mouth 4 (four) times daily. 07/20/17 07/27/17  Dailey Buccheri, PA-C  polyethylene glycol powder (GLYCOLAX/MIRALAX) powder Take 17 g by mouth daily. Patient not taking: Reported on 07/20/2017 02/19/13   Freeman Caldron, PA-C  rivaroxaban (XARELTO) 10 MG TABS tablet Take 1 tablet (10 mg total) by mouth daily. Patient not taking: Reported on 07/20/2017 02/18/13   [name], MD  senna-docusate (SENOKOT-S) 8.6-50 MG per tablet Take 1 tablet by mouth at bedtime. Patient not taking: Reported on 07/20/2017 12/17/14   Pisciotta, Joni Reining, PA-C  traMADol (ULTRAM) 50 MG tablet Take 2 tablets (100 mg total) by mouth every 6 (six) hours. Patient not taking: Reported on 07/20/2017 02/19/13   Freeman Caldron, PA-C    Family History History reviewed. No pertinent family history.  Social History Social History  Substance Use Topics  . Smoking status: Current Every Day Smoker    Types: Cigarettes  . Smokeless tobacco: Not on file  . Alcohol use Yes     Allergies   Patient has no known allergies.   Review of Systems Review of Systems  Constitutional: Negative for appetite change, chills and fever.  HENT: Positive for dental problem and mouth sores. Negative for ear pain, rhinorrhea, sneezing, sore throat, trouble swallowing and voice change.   Eyes: Negative for photophobia and visual disturbance.  Respiratory: Negative for cough, chest tightness, shortness of breath and wheezing.   Cardiovascular: Negative for chest pain and palpitations.  Gastrointestinal: Positive for vomiting. Negative for abdominal pain, blood in stool, constipation, diarrhea and nausea.  Genitourinary: Negative for dysuria, hematuria and urgency.  Musculoskeletal: Positive for arthralgias and joint swelling. Negative for myalgias.  Skin: Negative for rash.  Neurological: Negative for dizziness, weakness and light-headedness.     Physical Exam Updated Vital Signs BP 122/77   Pulse 64   Temp 98.8 F (37.1 C) (Oral)    Resp 16   Ht 5\' 11"  (1.803 m)   Wt 79.4 kg (175 lb)   SpO2 94%   BMI 24.41 kg/m   Physical Exam  Constitutional: He appears well-developed and well-nourished. No distress.  HENT:  Head: Normocephalic and atraumatic.  Nose: Nose normal.  Mouth/Throat: Uvula is midline, oropharynx is clear and moist and mucous membranes are normal.    Poor dentition overall. There are several chipped, cracked and missing teeth present. Tenderness to palpation in the indicated area. There are several white mouth sores consistent with thrush. No facial, neck or cheek swelling noted. No pooling of secretions or trismus.  Normal voice noted with no difficulty swallowing or breathing.  Eyes: Conjunctivae and EOM are normal. Right eye exhibits no discharge. Left eye exhibits no discharge. No scleral icterus.  Neck: Normal range of motion. Neck supple.  Cardiovascular: Normal rate, regular rhythm, normal heart sounds and intact distal pulses.  Exam reveals no gallop and no friction rub.   No murmur heard. Pulmonary/Chest: Effort normal and breath sounds normal. No respiratory distress.  Abdominal: Soft. Bowel sounds are normal. He exhibits no distension. There is no  tenderness. There is no guarding.  Musculoskeletal: Normal range of motion. He exhibits edema and tenderness.       Feet:  Tenderness to palpation on the right ankle at the indicated area. There is some edema present. Patient intact to light touch. 2+ deep pulses bilaterally. Full active and passive range of motion of the ankle.  Neurological: He is alert. He exhibits normal muscle tone. Coordination normal.  Skin: Skin is warm and dry. No rash noted.  Psychiatric: He has a normal mood and affect.  Nursing note and vitals reviewed.    ED Treatments / Results  Labs (all labs ordered are listed, but only abnormal results are displayed) Labs Reviewed - No data to display  EKG  EKG Interpretation None       Radiology Dg Ankle Complete  Right  Result Date: 07/20/2017 CLINICAL DATA:  Right ankle pain. History of ankle fracture, post ORIF. Evaluate for hardware failure. EXAM: RIGHT ANKLE - COMPLETE 3+ VIEW COMPARISON:  Right ankle radiographs - 02/09/2013; intraoperative fluoroscopic images of the right ankle - 02/17/2013 FINDINGS: Stable sequela of complex sideplate fixation of both the distal fibula and tibia. No evidence of hardware failure or loosening. No fracture or dislocation. 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 No ankle joint effusion.  Regional soft tissues appear normal. IMPRESSION: 1. No acute findings. 2. Post complex sideplate fixation of the distal fibula and tibia without evidence of hardware failure or loosening. 3. 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 Electronically Signed   By: Simonne Come M.D.   On: 07/20/2017 08:29    Procedures Procedures (including critical care time)  Medications Ordered in ED Medications  penicillin v potassium (VEETID) tablet 500 mg (not administered)  HYDROcodone-acetaminophen (NORCO/VICODIN) 5-325 MG per tablet 1 tablet (1 tablet Oral Given 07/20/17 0837)     Initial Impression / Assessment and Plan / ED Course  I have reviewed the triage vital signs and the nursing notes.  Pertinent labs & imaging results that were available during my care of the patient were reviewed by me and considered in my medical decision making (see chart for details).     Patient presents to ED for evaluation of dental pain that began one week ago. He also reports right ankle pain. He has overall poor dentition. There are several chipped and missing teeth. There is associated tenderness to palpation of the gum line but no visible swelling or site of drainage. There are several white mouth sores consistent with thrush. He has no trismus, drooling or pooling of  secretions. He is not appear in respiratory distress. He is tolerating secretions. Low suspicion for Ludwig angina or other severe deep tissue infection of the neck. X-ray of ankle showed no acute changes and no changes in hardware from previous surgery. He is able to ambulate. There is no significant color temperature change noted. Low suspicion for septic joint. We'll discharge patient with penicillin, nystatin swish for mouth ulcers. Given pain medication first dose of penicillin here in the ED. Will advise to follow-up with dentist for further evaluation. Patient appears stable for discharge at this time. Strict return precautions given.  Final Clinical Impressions(s) / ED Diagnoses   Final diagnoses:  Pain, dental  Chronic pain of right ankle    New Prescriptions New Prescriptions   NYSTATIN (MYCOSTATIN) 100000 UNIT/ML SUSPENSION    Take 5 mLs (500,000 Units total) by mouth 4 (  four) times daily.   PENICILLIN V POTASSIUM (VEETID) 500 MG TABLET    Take 1 tablet (500 mg total) by mouth 4 (four) times daily.     Dietrich Pates, PA-C 07/20/17 [id number]    Alvira Monday, MD 07/22/17 1406

## 2017-07-20 NOTE — ED Notes (Signed)
Patient to xray.

## 2019-06-02 ENCOUNTER — Encounter (HOSPITAL_COMMUNITY): Payer: Self-pay

## 2019-06-02 ENCOUNTER — Inpatient Hospital Stay (HOSPITAL_COMMUNITY)
Admission: AD | Admit: 2019-06-02 | Discharge: 2019-06-08 | DRG: 881 | Disposition: A | Discharge status: Home / Self Care | Payer: Federal, State, Local not specified - Other | Source: Other Acute Inpatient Hospital | Attending: Psychiatry | Admitting: Psychiatry

## 2019-06-02 ENCOUNTER — Other Ambulatory Visit: Payer: Self-pay | Admitting: Behavioral Health

## 2019-06-02 ENCOUNTER — Other Ambulatory Visit: Payer: Self-pay

## 2019-06-02 DIAGNOSIS — R45851 Suicidal ideations: Secondary | ICD-10-CM | POA: Diagnosis present

## 2019-06-02 DIAGNOSIS — G4700 Insomnia, unspecified: Secondary | ICD-10-CM | POA: Diagnosis not present

## 2019-06-02 DIAGNOSIS — F329 Major depressive disorder, single episode, unspecified: Principal | ICD-10-CM | POA: Diagnosis present

## 2019-06-02 DIAGNOSIS — B1920 Unspecified viral hepatitis C without hepatic coma: Secondary | ICD-10-CM | POA: Diagnosis present

## 2019-06-02 DIAGNOSIS — F332 Major depressive disorder, recurrent severe without psychotic features: Secondary | ICD-10-CM

## 2019-06-02 DIAGNOSIS — F1124 Opioid dependence with opioid-induced mood disorder: Secondary | ICD-10-CM | POA: Diagnosis not present

## 2019-06-02 DIAGNOSIS — Z7901 Long term (current) use of anticoagulants: Secondary | ICD-10-CM | POA: Diagnosis not present

## 2019-06-02 DIAGNOSIS — F17210 Nicotine dependence, cigarettes, uncomplicated: Secondary | ICD-10-CM | POA: Diagnosis present

## 2019-06-02 DIAGNOSIS — F1123 Opioid dependence with withdrawal: Secondary | ICD-10-CM | POA: Diagnosis present

## 2019-06-02 MED ORDER — ACETAMINOPHEN 325 MG PO TABS
650.0000 mg | ORAL_TABLET | Freq: Four times a day (QID) | ORAL | Status: DC | PRN
  Administered 2019-06-03: 22:00:00 650 mg via ORAL
  Filled 2019-06-02: qty 2

## 2019-06-02 MED ORDER — DICYCLOMINE HCL 20 MG PO TABS
20.0000 mg | ORAL_TABLET | Freq: Four times a day (QID) | ORAL | Status: AC | PRN
  Administered 2019-06-03 – 2019-06-06 (×6): 20 mg via ORAL
  Filled 2019-06-02 (×6): qty 1

## 2019-06-02 MED ORDER — CLONIDINE HCL 0.1 MG PO TABS
0.1000 mg | ORAL_TABLET | ORAL | Status: AC
  Administered 2019-06-04 – 2019-06-06 (×4): 0.1 mg via ORAL
  Filled 2019-06-02 (×4): qty 1

## 2019-06-02 MED ORDER — LOPERAMIDE HCL 2 MG PO CAPS: 2.0000 mg | ORAL_CAPSULE | ORAL | Status: AC | PRN

## 2019-06-02 MED ORDER — ALUM & MAG HYDROXIDE-SIMETH 200-200-20 MG/5ML PO SUSP: 30.0000 mL | ORAL | Status: DC | PRN

## 2019-06-02 MED ORDER — CLONIDINE HCL 0.1 MG PO TABS
0.1000 mg | ORAL_TABLET | Freq: Every day | ORAL | Status: AC
  Administered 2019-06-07 – 2019-06-08 (×2): 0.1 mg via ORAL
  Filled 2019-06-02 (×2): qty 1

## 2019-06-02 MED ORDER — NICOTINE 21 MG/24HR TD PT24
21.0000 mg | MEDICATED_PATCH | Freq: Every day | TRANSDERMAL | Status: DC
  Administered 2019-06-02 – 2019-06-08 (×7): 21 mg via TRANSDERMAL
  Filled 2019-06-02 (×11): qty 1

## 2019-06-02 MED ORDER — ONDANSETRON 4 MG PO TBDP: 4.0000 mg | ORAL_TABLET | Freq: Four times a day (QID) | ORAL | Status: AC | PRN

## 2019-06-02 MED ORDER — CLONIDINE HCL 0.1 MG PO TABS
0.1000 mg | ORAL_TABLET | Freq: Four times a day (QID) | ORAL | Status: AC
  Administered 2019-06-02 – 2019-06-04 (×7): 0.1 mg via ORAL
  Filled 2019-06-02 (×11): qty 1

## 2019-06-02 MED ORDER — NAPROXEN 500 MG PO TABS
500.0000 mg | ORAL_TABLET | Freq: Two times a day (BID) | ORAL | Status: AC | PRN
  Administered 2019-06-02 – 2019-06-07 (×9): 500 mg via ORAL
  Filled 2019-06-02 (×9): qty 1

## 2019-06-02 MED ORDER — SERTRALINE HCL 50 MG PO TABS
50.0000 mg | ORAL_TABLET | Freq: Every day | ORAL | Status: DC
  Administered 2019-06-02 – 2019-06-05 (×4): 50 mg via ORAL
  Filled 2019-06-02 (×7): qty 1

## 2019-06-02 MED ORDER — METHOCARBAMOL 500 MG PO TABS
500.0000 mg | ORAL_TABLET | Freq: Three times a day (TID) | ORAL | Status: DC | PRN
  Administered 2019-06-02 – 2019-06-06 (×6): 500 mg via ORAL
  Filled 2019-06-02 (×6): qty 1

## 2019-06-02 MED ORDER — HYDROXYZINE HCL 25 MG PO TABS
25.0000 mg | ORAL_TABLET | Freq: Four times a day (QID) | ORAL | Status: AC | PRN
  Administered 2019-06-02 – 2019-06-07 (×11): 25 mg via ORAL
  Filled 2019-06-02 (×11): qty 1

## 2019-06-02 NOTE — Progress Notes (Signed)
Patient ID: [name], male   DOB: [birth year], 43 y.o.   MRN: [id number]  Patient admitted to the unit in no acute distress. Patient states he is here for heroin detox. Patient states if he does not detox and go to rehab he will be suicidal but denies plan at this time. Patient states he would like to go to [facility]. Patient states he lives with his boss but is unsure if he can discharge back there. Patient denies legal issues. Patient is pleasant and cooperative. Patient denies HI/AVH or concerns.  Skin assessment was completed and unremarkable except for track marks to his arms bilaterally. Patient belongings searched with no contraband found. Belongings secured in locker. Vital signs obtained. Snacks and fluids offered.    A) Plan of care, unit policies and patient expectations were explained. Written consents obtained. Patient oriented to the unit and their room. Patient placed on standard q15 safety checks. High fall risk precautions initiated and reviewed with patient.   R) Patient is in no acute distress and verbalizes understanding of information provided. Patient with no concerns at this time. Patient contracts for safety with staff on the unit.

## 2019-06-02 NOTE — Progress Notes (Signed)
Pt was in bed in his room upon initial approach.  He denies SI/HI, denies hallucinations.  Pt complains of muscle spasms.  He reports withdrawal symptoms of tremor, sweats, muscle spasms.  He has been isolative to his room for the majority of the evening.   Met with pt 1:1.  Actively listened to pt and provided support and encouragement.  Medication administered per order.  PRN medication administered for muscle spasms.  15 minute safety checks in place.   Pt is compliant with medications.  He verbally contracts for safety.  Will continue to monitor and assess.

## 2019-06-02 NOTE — [facility] Suicide Risk Assessment (Signed)
[facility] Admission Suicide Risk Assessment   Nursing information obtained from:   patient and chart Demographic factors:   43 year old male Current Mental Status:   Separated  Loss Factors:   substance abuse/ limited support network Historical Factors:   history of opiate use disorder, history of depression Risk Reduction Factors:   resilience  Total Time spent with patient: 45 minutes Principal Problem: Opiate Dependence, Opiate WDL, Opiate Induced Mood Disorder, Depressed versus MDD Diagnosis:  Active Problems:   MDD (major depressive disorder)  Subjective Data:   Continued Clinical Symptoms:    The "Alcohol Use Disorders Identification Test", Guidelines for Use in Primary Care, Second Edition.  World Pharmacologist [facility]). Score between 0-7:  no or low risk or alcohol related problems. Score between 8-15:  moderate risk of alcohol related problems. Score between 16-19:  high risk of alcohol related problems. Score 20 or above:  warrants further diagnostic evaluation for alcohol dependence and treatment.   CLINICAL FACTORS:  43 year old male, presented to ED voluntarily reporting worsening depression, SI, neuro-vegetative symptoms, as well as opiate ( IV heroin) use disorder, with symptoms of opiate WDL.   Psychiatric Specialty Exam: Physical Exam  ROS  Blood pressure 104/66, pulse 84, temperature 98.4 F (36.9 C), temperature source Oral, resp. rate 16, SpO2 100 %.There is no height or weight on file to calculate BMI.  See admit note MSE                                                        COGNITIVE FEATURES THAT CONTRIBUTE TO RISK:  Closed-mindedness and Loss of executive function    SUICIDE RISK:   Moderate:  Frequent suicidal ideation with limited intensity, and duration, some specificity in terms of plans, no associated intent, good self-control, limited dysphoria/symptomatology, some risk factors present, and identifiable protective  factors, including available and accessible social support.  PLAN OF CARE: Patient will be admitted to inpatient psychiatric unit for stabilization and safety. Will provide and encourage milieu participation. Provide medication management and maked adjustments as needed. Will also provide medication management to address opiate WDL.  Will follow daily.    I certify that inpatient services furnished can reasonably be expected to improve the patient's condition.   Jenne Campus, MD 06/02/2019, 5:55 PM

## 2019-06-02 NOTE — Tx Team (Signed)
Initial Treatment Plan 06/02/2019 6:46 PM [name] QXI:[id number]    PATIENT STRESSORS: Substance abuse   PATIENT STRENGTHS: Average or above average intelligence Capable of independent living Physical Health   PATIENT IDENTIFIED PROBLEMS: "help with long term treatment"  "mood medicines"  Substance Abuse                 DISCHARGE CRITERIA:  Ability to meet basic life and health needs Adequate post-discharge living arrangements Improved stabilization in mood, thinking, and/or behavior Medical problems require only outpatient monitoring Motivation to continue treatment in a less acute level of care Need for constant or close observation no longer present Reduction of life-threatening or endangering symptoms to within safe limits Safe-care adequate arrangements made Verbal commitment to aftercare and medication compliance Withdrawal symptoms are absent or subacute and managed without 24-hour nursing intervention  PRELIMINARY DISCHARGE PLAN: Outpatient therapy  PATIENT/FAMILY INVOLVEMENT: This treatment plan has been presented to and reviewed with the patient, [name].  The patient and family have been given the opportunity to ask questions and make suggestions.  Annia Friendly, RN 06/02/2019, 6:46 PM

## 2019-06-02 NOTE — Progress Notes (Signed)
Psychoeducational Group Note  Date:  06/02/2019 Time:2030  Group Topic/Focus:  wrap up group  Participation Level: Did Not Attend  Participation Quality:  Not Applicable  Affect:  Not Applicable  Cognitive:  Not Applicable  Insight:  Not Applicable  Engagement in Group: Not Applicable  Additional Comments:  Pt remained asleep during group time.   Winfield Rast S 06/02/2019, 10:03 PM
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
## 2019-06-02 NOTE — H&P (Signed)
Psychiatric Admission Assessment Adult  Patient Identification: Andrew QuamDaniel Doutt III MRN:  [id number] Date of Evaluation:  06/02/2019 Chief Complaint: " Using Heroin, need help" Principal Diagnosis:  Opiate Use Disorder, Opiate Induced Mood Disorder, Depressed . Opiate WDL.  Diagnosis:  Opiate Use Disorder, Opiate Induced Mood Disorder, Depressed . Opiate WDL.  History of Present Illness: 43 year old male, [facility] ( [facility]). 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 Reports worsening depression, neuro-vegetative symptoms of depression, worsening  in the context of trying to taper self off opiates and experiencing withdrawal symptoms. On admission reported suicidal ideations with thoughts of shooting self . (At this time denies suicidal ideations/ reports " I was just feeling miserable with withdrawal, all I want is to feel better"). Endorses neuro-vegetative symptoms of depression as below, denies psychotic symptoms. At this time describes  symptoms of opiate withdrawal- nausea, myalgias, cramps, loose stools, feeling restless. Associated Signs/Symptoms: Depression Symptoms:  depressed mood, anhedonia, insomnia, suicidal thoughts with specific plan, loss of energy/fatigue, decreased appetite, (Hypo) Manic Symptoms: none endorsed or noted  Anxiety Symptoms:  Reports increased anxiety recently/panic symptoms Psychotic Symptoms: Denies  PTSD Symptoms: Denies  Total Time spent with patient: 45 minutes  Past Psychiatric History: Patient describes history of depression, which he states is at partially substance induced, tends to improve during periods of sobriety, although he does feel he has depression even when abstinent. Denies history of suicide attempts or of self injurious behaviors. Denies history of psychosis. Describes increased mood instability when actively abusing drugs, no clear history of  mania or hypomania.Denies history of violence.   Is the patient at risk to self? Yes.    Has the patient been a risk to self in the past 6 months? No.  Has the patient been a risk to self within the distant past? No.  Is the patient a risk to others? No.  Has the patient been a risk to others in the past 6 months? No.  Has the patient been a risk to others within the distant past? No.   Prior Inpatient Therapy: Prior Inpatient Therapy: Yes Prior Therapy Dates: 2019 Prior Therapy Facilty/Provider(s): [facility] Reason for Treatment: opiate addiction Prior Outpatient Therapy: Prior Outpatient Therapy: No Does patient have an ACCT team?: No Does patient have Intensive In-House Services?  : No Does patient have [facility] services? : No Does patient have P4CC services?: No  Alcohol Screening:   Substance Abuse History in the last 12 months:  History of opiate dependence, reports initially abused  opiate medications,more recently has been using  IV heroin. Last used 2 days ago. Denies alcohol or BZD abuse .  Consequences of Substance Abuse: Medical illness ( Hep C+) Previous Psychotropic Medications: Was not taking medications prior to admission. States he has never been on psychiatric medication management in the past . Psychological Evaluations:  No  Past Medical History: Hep C (+), denies other medical illnesses,  , NKDA Past Medical History:  Diagnosis Date  . Medical history non-contributory     Past Surgical History:  Procedure Laterality Date  . COLON SURGERY    . EXTERNAL FIXATION LEG Right 02/09/2013   Procedure: EXTERNAL FIXATION LEG;  Surgeon: Eulas PostJoshua P Landau, MD;  Location: [facility] OR;  Service: Orthopedics;  Laterality: Right;  . LAPAROTOMY N/A 02/09/2013   Procedure: EXPLORATORY LAPAROTOMY with small bowel resection and repair of mesentery;  Surgeon: Marta LamasJames O  Hulen Skains, MD;  Location: [place];  Service: General;  Laterality: N/A;  . OPEN REDUCTION INTERNAL  FIXATION (ORIF) TIBIA/FIBULA FRACTURE Right 02/17/2013   Procedure: OPEN REDUCTION INTERNAL FIXATION (ORIF) RIGHT TIBIAL PILON AND ORIF RIGHT FIBULA FRACTURE AND REMOVAL OF SYNTHESE EXFIX;  Surgeon: Wylene Simmer, MD;  Location: [place];  Service: Orthopedics;  Laterality: Right;   Family History: parents deceased .Mother died in [place] 2 years ago, father died from [place] 3 years ago.  Has one sister. Family Psychiatric  History: no history of mental illness in family, mother had history of alcohol abuse.No suicides in family Tobacco Screening:  smokes 1 PPD Social History: 27, divorced, has two children ( 47, 82), lives alone, employed .  Social History   Substance and Sexual Activity  Alcohol Use Yes     Social History   Substance and Sexual Activity  Drug Use No    Additional Social History: Marital status: Single    Pain Medications: see MAR Prescriptions: see MAR Over the Counter: see MAR History of alcohol / drug use?: Yes Name of Substance 1: Opiates 1 - Age of First Use: not assessed 1 - Amount (size/oz): not assessed 1 - Frequency: UTA 1 - Duration: 3-4 months 1 - Last Use / Amount: not assessed  Allergies:  No Known Allergies Lab Results: No results found for this or any previous visit (from the past 4 hour(s)).  Blood Alcohol level:  No results found for: [facility]  Metabolic Disorder Labs:  No results found for: HGBA1C, MPG No results found for: PROLACTIN No results found for: CHOL, TRIG, HDL, CHOLHDL, VLDL, LDLCALC  Current Medications: No current facility-administered medications for this encounter.    PTA Medications: Medications Prior to Admission  Medication Sig Dispense Refill Last Dose  . acetaminophen (TYLENOL) 500 MG tablet Take 1,500-2,000 mg by mouth every 6 (six) hours as needed for moderate pain.     Marland Kitchen docusate sodium 100 MG CAPS Take 100 mg by mouth 2 (two) times daily as needed. (Patient not taking: Reported on 07/20/2017) 10 capsule    .  HYDROcodone-acetaminophen (NORCO/VICODIN) 5-325 MG per tablet Take 2 tablets by mouth every 4 (four) hours as needed. (Patient not taking: Reported on 07/20/2017) 20 tablet 0   . hydrocortisone (ANUSOL-HC) 2.5 % rectal cream Apply rectally 2 times daily (Patient not taking: Reported on 07/20/2017) 28.35 g 0   . naproxen sodium (ANAPROX) 220 MG tablet Take 440 mg by mouth 2 (two) times daily as needed (pain).     . nystatin (MYCOSTATIN) 100000 UNIT/ML suspension Take 5 mLs (500,000 Units total) by mouth 4 (four) times daily. 60 mL 0   . OxyCODONE (OXYCONTIN) 80 mg T12A Take 1 tablet (80 mg total) by mouth every 12 (twelve) hours. (Patient not taking: Reported on 07/20/2017) 10 tablet 0   . OxyCODONE 60 MG T12A Take 60 mg by mouth every 12 (twelve) hours. Do not fill before 02/23/13 (Patient not taking: Reported on 07/20/2017) 10 tablet 0   . polyethylene glycol powder (GLYCOLAX/MIRALAX) powder Take 17 g by mouth daily. (Patient not taking: Reported on 07/20/2017)     . rivaroxaban (XARELTO) 10 MG TABS tablet Take 1 tablet (10 mg total) by mouth daily. (Patient not taking: Reported on 07/20/2017) 14 tablet 0   . senna-docusate (SENOKOT-S) 8.6-50 MG per tablet Take 1 tablet by mouth at bedtime. (Patient not taking: Reported on 07/20/2017) 30 tablet 0   . traMADol (ULTRAM) 50 MG tablet Take 2 tablets (100 mg  total) by mouth every 6 (six) hours. (Patient not taking: Reported on 07/20/2017) 100 tablet 1     Musculoskeletal: Strength & Muscle Tone: within normal limits Gait & Station: normal Patient leans: N/A  Psychiatric Specialty Exam: Physical Exam  Review of Systems  Constitutional: Positive for chills.  Eyes: Negative.   Respiratory: Negative for cough and shortness of breath.   Cardiovascular: Negative for chest pain.  Gastrointestinal: Positive for diarrhea.  Genitourinary: Negative.   Musculoskeletal: Positive for myalgias.  Skin: Negative for rash.  Neurological: Negative for seizures and  headaches.  Psychiatric/Behavioral: Positive for depression, substance abuse and suicidal ideas.    Blood pressure 104/66, pulse 84, temperature 98.4 F (36.9 C), temperature source Oral, resp. rate 16, SpO2 100 %.There is no height or weight on file to calculate BMI.  General Appearance: Fairly Groomed  Eye Contact:  Fair  Speech:  Normal Rate  Volume:  Decreased  Mood:  depressed, dysphoric  Affect:  Congruent  Thought Process:  Linear and Descriptions of Associations: Intact  Orientation:  Other:  fully alert and attentive, oriented x 3   Thought Content:  no hallucinations, no delusions, not internally preoccupied   Suicidal Thoughts:  No currently denies suicidal or self injurious ideations, and contracts for safety on unit. Denies any homicidal or violent ideations  Homicidal Thoughts:  No  Memory:  recent and remote grossly intact  Judgement:  Fair  Insight:  Fair  Psychomotor Activity:  Normal- no current psychomotor agitation or restlessness   Concentration:  Concentration: Good and Attention Span: Good  Recall:  Good  Fund of Knowledge:  Good  Language:  Good  Akathisia:  Negative  Handed:  Right  AIMS (if indicated):     Assets:  Desire for Improvement Resilience  ADL's:  Intact  Cognition:  WNL  Sleep:       Treatment Plan Summary: Daily contact with patient to assess and evaluate symptoms and progress in treatment, Medication management, Plan inpatient treatment  and medications as below  Observation Level/Precautions:  15 minute checks  Laboratory:  as needed  Check BMP to follow up on electrolytes ( mild hypokalemia in ED) .  Psychotherapy:  Milieu , group therapy  Medications: We discussed options- he states he is interested in starting an antidepressant medication . We discussed options.  Start Zoloft 50 mgrs QDAY . Start Clonidine detox protocol to address opiate WDL symptoms   Consultations:  As needed  Discharge Concerns:  -  Estimated LOS:3-4 days    Other:  Expresses interest in being referred to rehab [facility]( [facility]) following discharge   Physician Treatment Plan for Primary Diagnosis:  Opiate Use Disorder  Long Term Goal(s): Improvement in symptoms so as ready for discharge  Short Term Goals: Ability to identify triggers associated with substance abuse/mental health issues will improve  Physician Treatment Plan for Secondary Diagnosis: Opiate Induced Mood Disorder versus MDD Long Term Goal(s): Improvement in symptoms so as ready for discharge  Short Term Goals: Ability to identify changes in lifestyle to reduce recurrence of condition will improve, Ability to verbalize feelings will improve, Ability to disclose and discuss suicidal ideas, Ability to demonstrate self-control will improve, Ability to identify and develop effective coping behaviors will improve, Ability to maintain clinical measurements within normal limits will improve and Compliance with prescribed medications will improve  I certify that inpatient services furnished can reasonably be expected to improve the patient's condition.    Craige CottaFernando A Shyheim Tanney, MD 6/25/20205:19 PM

## 2019-06-03 NOTE — Tx Team (Signed)
Interdisciplinary Treatment and Diagnostic Plan Update  06/03/2019 Time of Session: 9:00am [name] MRN: [id number]  Principal Diagnosis: <principal problem not specified>  Secondary Diagnoses: Active Problems:   MDD (major depressive disorder)   Current Medications:  Current Facility-Administered Medications  Medication Dose Route Frequency Provider Last Rate Last Dose  . acetaminophen (TYLENOL) tablet 650 mg  650 mg Oral Q6H PRN Mordecai Maes, NP      . alum & mag hydroxide-simeth (MAALOX/MYLANTA) 200-200-20 MG/5ML suspension 30 mL  30 mL Oral Q4H PRN Mordecai Maes, NP      . cloNIDine (CATAPRES) tablet 0.1 mg  0.1 mg Oral QID Cobos, Myer Peer, MD   0.1 mg at 06/03/19 0800   Followed by  . [START ON 06/04/2019] cloNIDine (CATAPRES) tablet 0.1 mg  0.1 mg Oral BH-qamhs Cobos, Myer Peer, MD       Followed by  . [START ON 06/07/2019] cloNIDine (CATAPRES) tablet 0.1 mg  0.1 mg Oral QAC breakfast Cobos, Fernando A, MD      . dicyclomine (BENTYL) tablet 20 mg  20 mg Oral Q6H PRN Cobos, Myer Peer, MD   20 mg at 06/03/19 0801  . hydrOXYzine (ATARAX/VISTARIL) tablet 25 mg  25 mg Oral Q6H PRN Cobos, Myer Peer, MD   25 mg at 06/03/19 0801  . loperamide (IMODIUM) capsule 2-4 mg  2-4 mg Oral PRN Cobos, Myer Peer, MD      . methocarbamol (ROBAXIN) tablet 500 mg  500 mg Oral Q8H PRN Cobos, Myer Peer, MD   500 mg at 06/02/19 2148  . naproxen (NAPROSYN) tablet 500 mg  500 mg Oral BID PRN Cobos, Myer Peer, MD   500 mg at 06/03/19 0801  . nicotine (NICODERM CQ - dosed in mg/24 hours) patch 21 mg  21 mg Transdermal Daily Cobos, Myer Peer, MD   21 mg at 06/03/19 0800  . ondansetron (ZOFRAN-ODT) disintegrating tablet 4 mg  4 mg Oral Q6H PRN Cobos, Myer Peer, MD      . sertraline (ZOLOFT) tablet 50 mg  50 mg Oral Daily Cobos, Myer Peer, MD   50 mg at 06/03/19 0800   PTA Medications: Medications Prior to Admission  Medication Sig Dispense Refill Last Dose  . acetaminophen  (TYLENOL) 500 MG tablet Take 1,500-2,000 mg by mouth every 6 (six) hours as needed for moderate pain.     Marland Kitchen docusate sodium 100 MG CAPS Take 100 mg by mouth 2 (two) times daily as needed. (Patient not taking: Reported on 07/20/2017) 10 capsule    . HYDROcodone-acetaminophen (NORCO/VICODIN) 5-325 MG per tablet Take 2 tablets by mouth every 4 (four) hours as needed. (Patient not taking: Reported on 07/20/2017) 20 tablet 0   . hydrocortisone (ANUSOL-HC) 2.5 % rectal cream Apply rectally 2 times daily (Patient not taking: Reported on 07/20/2017) 28.35 g 0   . naproxen sodium (ANAPROX) 220 MG tablet Take 440 mg by mouth 2 (two) times daily as needed (pain).     . nystatin (MYCOSTATIN) 100000 UNIT/ML suspension Take 5 mLs (500,000 Units total) by mouth 4 (four) times daily. 60 mL 0   . OxyCODONE (OXYCONTIN) 80 mg T12A Take 1 tablet (80 mg total) by mouth every 12 (twelve) hours. (Patient not taking: Reported on 07/20/2017) 10 tablet 0   . OxyCODONE 60 MG T12A Take 60 mg by mouth every 12 (twelve) hours. Do not fill before 02/23/13 (Patient not taking: Reported on 07/20/2017) 10 tablet 0   . polyethylene glycol powder (GLYCOLAX/MIRALAX) powder Take 17  g by mouth daily. (Patient not taking: Reported on 07/20/2017)     . rivaroxaban (XARELTO) 10 MG TABS tablet Take 1 tablet (10 mg total) by mouth daily. (Patient not taking: Reported on 07/20/2017) 14 tablet 0   . senna-docusate (SENOKOT-S) 8.6-50 MG per tablet Take 1 tablet by mouth at bedtime. (Patient not taking: Reported on 07/20/2017) 30 tablet 0   . traMADol (ULTRAM) 50 MG tablet Take 2 tablets (100 mg total) by mouth every 6 (six) hours. (Patient not taking: Reported on 07/20/2017) 100 tablet 1     Patient Stressors: Substance abuse  Patient Strengths: Average or above average intelligence Capable of independent living Physical Health  Treatment Modalities: Medication Management, Group therapy, Case management,  1 to 1 session with clinician,  Psychoeducation, Recreational therapy.   Physician Treatment Plan for Primary Diagnosis: <principal problem not specified> Long Term Goal(s): Improvement in symptoms so as ready for discharge Improvement in symptoms so as ready for discharge   Short Term Goals: Ability to identify triggers associated with substance abuse/mental health issues will improve Ability to identify changes in lifestyle to reduce recurrence of condition will improve Ability to verbalize feelings will improve Ability to disclose and discuss suicidal ideas Ability to demonstrate self-control will improve Ability to identify and develop effective coping behaviors will improve Ability to maintain clinical measurements within normal limits will improve Compliance with prescribed medications will improve  Medication Management: Evaluate patient's response, side effects, and tolerance of medication regimen.  Therapeutic Interventions: 1 to 1 sessions, Unit Group sessions and Medication administration.  Evaluation of Outcomes: Not Met  Physician Treatment Plan for Secondary Diagnosis: Active Problems:   MDD (major depressive disorder)  Long Term Goal(s): Improvement in symptoms so as ready for discharge Improvement in symptoms so as ready for discharge   Short Term Goals: Ability to identify triggers associated with substance abuse/mental health issues will improve Ability to identify changes in lifestyle to reduce recurrence of condition will improve Ability to verbalize feelings will improve Ability to disclose and discuss suicidal ideas Ability to demonstrate self-control will improve Ability to identify and develop effective coping behaviors will improve Ability to maintain clinical measurements within normal limits will improve Compliance with prescribed medications will improve     Medication Management: Evaluate patient's response, side effects, and tolerance of medication regimen.  Therapeutic  Interventions: 1 to 1 sessions, Unit Group sessions and Medication administration.  Evaluation of Outcomes: Not Met   RN Treatment Plan for Primary Diagnosis: <principal problem not specified> Long Term Goal(s): Knowledge of disease and therapeutic regimen to maintain health will improve  Short Term Goals: Ability to remain free from injury will improve, Ability to verbalize frustration and anger appropriately will improve, Ability to identify and develop effective coping behaviors will improve and Compliance with prescribed medications will improve  Medication Management: RN will administer medications as ordered by provider, will assess and evaluate patient's response and provide education to patient for prescribed medication. RN will report any adverse and/or side effects to prescribing provider.  Therapeutic Interventions: 1 on 1 counseling sessions, Psychoeducation, Medication administration, Evaluate responses to treatment, Monitor vital signs and CBGs as ordered, Perform/monitor CIWA, COWS, AIMS and Fall Risk screenings as ordered, Perform wound care treatments as ordered.  Evaluation of Outcomes: Not Met   LCSW Treatment Plan for Primary Diagnosis: <principal problem not specified> Long Term Goal(s): Safe transition to appropriate next level of care at discharge, Engage patient in therapeutic group addressing interpersonal concerns.  Short Term Goals:  Engage patient in aftercare planning with referrals and resources, Increase social support, Identify triggers associated with mental health/substance abuse issues and Increase skills for wellness and recovery  Therapeutic Interventions: Assess for all discharge needs, 1 to 1 time with Social worker, Explore available resources and support systems, Assess for adequacy in community support network, Educate family and significant other(s) on suicide prevention, Complete Psychosocial Assessment, Interpersonal group therapy.  Evaluation of  Outcomes: Not Met   Progress in Treatment: Attending groups: No. New to unit. Participating in groups: No. Taking medication as prescribed: Yes. Toleration medication: Yes. Family/Significant other contact made: No, will contact:  supports if consents are granted Patient understands diagnosis: Yes. Discussing patient identified problems/goals with staff: Yes. Medical problems stabilized or resolved: No. Denies suicidal/homicidal ideation: Yes. Issues/concerns per patient self-inventory: Yes.  New problem(s) identified: Yes, Describe:  homelessness  New Short Term/Long Term Goal(s): detox, medication management for mood stabilization; elimination of SI thoughts; development of comprehensive mental wellness/sobriety plan.  Patient Goals:    Discharge Plan or Barriers: CSW continuing to assess for appropriate referrals.  Reason for Continuation of Hospitalization: Anxiety Depression Medication stabilization Withdrawal symptoms  Estimated Length of Stay: 3-5 days  Attendees: Patient: 06/03/2019 8:54 AM  Physician:  06/03/2019 8:54 AM  Nursing:  06/03/2019 8:54 AM  RN Care Manager: 06/03/2019 8:54 AM  Social Worker: Stephanie Acre, Latanya Presser 06/03/2019 8:54 AM  Recreational Therapist:  06/03/2019 8:54 AM  Other:  06/03/2019 8:54 AM  Other:  06/03/2019 8:54 AM  Other: 06/03/2019 8:54 AM    Scribe for Treatment Team: Joellen Jersey, [place] 06/03/2019 8:54 AM

## 2019-06-03 NOTE — Progress Notes (Signed)
[facility] MD Progress Note  06/03/2019 4:35 PM [name]  MRN:  [id number] Subjective: Patient reports persistent symptoms of opiate withdrawal.  Describes cramping, "restless legs", aches, nausea Denies vomiting and states he has been tolerating p.o. intake well.  Currently denies medication side effects.  Denies suicidal ideations.  Objective: I have discussed case with treatment team and have met with patient. 43 year old male, presented to ED voluntarily reporting worsening depression, SI, neuro-vegetative symptoms, as well as opiate ( IV heroin) use disorder, with symptoms of opiate WDL.  Currently patient presents in bed, vaguely dysphoric/irritable.  No psychomotor agitation. Limited milieu participation thus far. He describes lingering symptoms of opiate withdrawal.  As above, denies vomiting and is tolerating p.o. intake well.  No diarrhea endorsed. Vitals are stable.  Tolerating medications well thus far, denies side effects.  Principal Problem: Opiate Dependence, Opiate Induced Mood Disorder Diagnosis: Active Problems:   MDD (major depressive disorder)  Total Time spent with patient: 20 minutes  Past Psychiatric History:   Past Medical History:  Past Medical History:  Diagnosis Date  . Medical history non-contributory     Past Surgical History:  Procedure Laterality Date  . COLON SURGERY    . EXTERNAL FIXATION LEG Right 02/09/2013   Procedure: EXTERNAL FIXATION LEG;  Surgeon: Johnny Bridge, MD;  Location: [place];  Service: Orthopedics;  Laterality: Right;  . LAPAROTOMY N/A 02/09/2013   Procedure: EXPLORATORY LAPAROTOMY with small bowel resection and repair of mesentery;  Surgeon: Gwenyth Ober, MD;  Location: [place];  Service: General;  Laterality: N/A;  . OPEN REDUCTION INTERNAL FIXATION (ORIF) TIBIA/FIBULA FRACTURE Right 02/17/2013   Procedure: OPEN REDUCTION INTERNAL FIXATION (ORIF) RIGHT TIBIAL PILON AND ORIF RIGHT FIBULA FRACTURE AND REMOVAL OF SYNTHESE EXFIX;   Surgeon: Wylene Simmer, MD;  Location: [place];  Service: Orthopedics;  Laterality: Right;   Family History: History reviewed. No pertinent family history. Family Psychiatric  History:  Social History:  Social History   Substance and Sexual Activity  Alcohol Use Yes     Social History   Substance and Sexual Activity  Drug Use No    Social History   Socioeconomic History  . Marital status: Legally Separated    Spouse name: Not on file  . Number of children: Not on file  . Years of education: Not on file  . Highest education level: Not on file  Occupational History  . Not on file  Social Needs  . Financial resource strain: Not on file  . Food insecurity    Worry: Not on file    Inability: Not on file  . Transportation needs    Medical: Not on file    Non-medical: Not on file  Tobacco Use  . Smoking status: Current Every Day Smoker    Types: Cigarettes  . Smokeless tobacco: Never Used  Substance and Sexual Activity  . Alcohol use: Yes  . Drug use: No  . Sexual activity: Not on file  Lifestyle  . Physical activity    Days per week: Not on file    Minutes per session: Not on file  . Stress: Not on file  Relationships  . Social Herbalist on phone: Not on file    Gets together: Not on file    Attends religious service: Not on file    Active member of club or organization: Not on file    Attends meetings of clubs or organizations: Not on file  Relationship status: Not on file  Other Topics Concern  . Not on file  Social History Narrative  . Not on file   Additional Social History:    Pain Medications: see MAR Prescriptions: see MAR Over the Counter: see MAR History of alcohol / drug use?: Yes Name of Substance 1: Opiates 1 - Age of First Use: not assessed 1 - Amount (size/oz): not assessed 1 - Frequency: UTA 1 - Duration: 3-4 months 1 - Last Use / Amount: not assessed  Sleep: Fair  Appetite:  Fair  Current Medications: Current  Facility-Administered Medications  Medication Dose Route Frequency Provider Last Rate Last Dose  . acetaminophen (TYLENOL) tablet 650 mg  650 mg Oral Q6H PRN Mordecai Maes, NP      . alum & mag hydroxide-simeth (MAALOX/MYLANTA) 200-200-20 MG/5ML suspension 30 mL  30 mL Oral Q4H PRN Mordecai Maes, NP      . cloNIDine (CATAPRES) tablet 0.1 mg  0.1 mg Oral QID Otto Felkins, Myer Peer, MD   0.1 mg at 06/03/19 0800   Followed by  . [START ON 06/04/2019] cloNIDine (CATAPRES) tablet 0.1 mg  0.1 mg Oral BH-qamhs Jerrik Housholder, Myer Peer, MD       Followed by  . [START ON 06/07/2019] cloNIDine (CATAPRES) tablet 0.1 mg  0.1 mg Oral QAC breakfast Aldous Housel A, MD      . dicyclomine (BENTYL) tablet 20 mg  20 mg Oral Q6H PRN Verdia Bolt, Myer Peer, MD   20 mg at 06/03/19 0801  . hydrOXYzine (ATARAX/VISTARIL) tablet 25 mg  25 mg Oral Q6H PRN Jinny Sweetland, Myer Peer, MD   25 mg at 06/03/19 0801  . loperamide (IMODIUM) capsule 2-4 mg  2-4 mg Oral PRN Yaron Grasse, Myer Peer, MD      . methocarbamol (ROBAXIN) tablet 500 mg  500 mg Oral Q8H PRN Ellia Knowlton, Myer Peer, MD   500 mg at 06/02/19 2148  . naproxen (NAPROSYN) tablet 500 mg  500 mg Oral BID PRN Barett Whidbee, Myer Peer, MD   500 mg at 06/03/19 0801  . nicotine (NICODERM CQ - dosed in mg/24 hours) patch 21 mg  21 mg Transdermal Daily Alejandra Hunt, Myer Peer, MD   21 mg at 06/03/19 0800  . ondansetron (ZOFRAN-ODT) disintegrating tablet 4 mg  4 mg Oral Q6H PRN Henderson Frampton, Myer Peer, MD      . sertraline (ZOLOFT) tablet 50 mg  50 mg Oral Daily Kateria Cutrona, Myer Peer, MD   50 mg at 06/03/19 0800    Lab Results: No results found for this or any previous visit (from the past 69 hour(s)).  Blood Alcohol level:  No results found for: [facility]  Metabolic Disorder Labs: No results found for: HGBA1C, MPG No results found for: PROLACTIN No results found for: CHOL, TRIG, HDL, CHOLHDL, VLDL, LDLCALC  Physical Findings: AIMS: Facial and Oral Movements Muscles of Facial Expression: None, normal Lips and Perioral  Area: None, normal Jaw: None, normal Tongue: None, normal,Extremity Movements Upper (arms, wrists, hands, fingers): None, normal Lower (legs, knees, ankles, toes): None, normal, Trunk Movements Neck, shoulders, hips: None, normal, Overall Severity Severity of abnormal movements (highest score from questions above): None, normal Incapacitation due to abnormal movements: None, normal Patient's awareness of abnormal movements (rate only patient's report): No Awareness, Dental Status Current problems with teeth and/or dentures?: No Does patient usually wear dentures?: No  CIWA:    COWS:  COWS Total Score: 0  Musculoskeletal: Strength & Muscle Tone: within normal limits Gait & Station: normal Patient leans: N/A  Psychiatric Specialty Exam: Physical Exam  ROS reports cramps, myalgias, nausea, in the context of opiate withdrawal.  No vomiting, no diarrhea.  No fever, no chills.  Blood pressure 123/86, pulse 82, temperature 98.1 F (36.7 C), temperature source Oral, resp. rate 20, height 6' (1.829 m), weight 84.2 kg, SpO2 100 %.Body mass index is 25.17 kg/m.  General Appearance: Fairly Groomed  Eye Contact:  Fair  Speech:  Normal Rate  Volume:  Decreased  Mood:  Depressed and Dysphoric  Affect:  Congruent  Thought Process:  Linear and Descriptions of Associations: Intact  Orientation:  Full (Time, Place, and Person)  Thought Content:  No hallucinations, no delusions  Suicidal Thoughts:  No currently denies suicidal or self-injurious ideations, denies homicidal or violent ideations  Homicidal Thoughts:  No  Memory:  Recent and remote grossly intact  Judgement:  Fair/improving  Insight: Present  Psychomotor Activity:  Decreased-no psychomotor agitation  Concentration:  Concentration: Fair and Attention Span: Fair  Recall:  Good  Fund of Knowledge:  Good  Language:  Good  Akathisia:  No  Handed:  Right  AIMS (if indicated):     Assets:  Desire for Improvement Resilience  ADL's:   Intact  Cognition:  WNL  Sleep:  Number of Hours: 5.25   Assessment:  59 y old male, presented to ED voluntarily reporting worsening depression, SI, neuro-vegetative symptoms, as well as opiate ( IV heroin) use disorder, with symptoms of opiate WDL.   Currently patient presents vaguely dysphoric.  Denies suicidal ideations.  Endorses lingering/persistent symptoms of opiate withdrawal.  Tolerating p.o. intake well without vomiting today.  Thus far tolerating Zoloft trial well .   Plan- Encourage group and milieu participation to work on coping skills and symptom reduction Encourage efforts to work on sobriety and relapse prevention Continue clonidine detox protocol to address opiate withdrawal symptoms Continue Zoloft 50 mg daily for depression Treatment team working on disposition planning options

## 2019-06-03 NOTE — Progress Notes (Signed)
D: Pt was in bed in his room upon initial approach.  Pt presents with depressed affect and mood.  He reports he feels "like crap" and his goal is to "get through the night."  Pt denies SI/HI, denies hallucinations, reports generalized pain 7/10.  Pt reports withdrawal symptoms of stomach cramps, chills, body aches.  Pt has been isolative to his room for the majority of the evening.  A: Introduced self to pt.  Met with pt 1:1.  Actively listened to pt and offered support and encouragement.  Medications administered per order.  PRN medication administered for pain and muscle spasms.  15 minute safety checks in place.  R: Pt is safe on the unit.  Pt is compliant with medications.  Pt verbally contracts for safety.  Will continue to monitor and assess.

## 2019-06-03 NOTE — Progress Notes (Signed)
Patient ID: [name], male   DOB: [birth year], 43 y.o.   MRN: [id number]  D: Patient is isolative to his room with complaints of withdrawal symptoms. PRNs provided with relief. Patient denies SI/HI/AVH. A: Safety maintained with q15 minute checks. Medications provided as ordered. High fall risk precautions in place.  R: Patient remains safe on the unit at this time.

## 2019-06-04 DIAGNOSIS — F329 Major depressive disorder, single episode, unspecified: Principal | ICD-10-CM

## 2019-06-04 MED ORDER — TRAZODONE HCL 100 MG PO TABS
100.0000 mg | ORAL_TABLET | Freq: Every evening | ORAL | Status: DC | PRN
  Administered 2019-06-04 – 2019-06-05 (×4): 100 mg via ORAL
  Filled 2019-06-04 (×9): qty 1

## 2019-06-04 NOTE — Progress Notes (Addendum)
[facility]  06/04/2019 12:52 PM Andrew QuamDaniel Doolittle Lucas  MRN:  [id number] Subjective:  "I'm withdrawing."  [name] found lying in bed. Appears fatigued. He reports withdrawal symptoms- malaise, generalized myalgias, diaphoresis, indigestion, diarrhea. VSS. Clonidine detox protocol continues. He has been eating and drinking fluids. He has been isolating to his room, staying in bed. Denies SI/HI/AVH. Denies medication side effects.  From admission H&P: 43 year old male, [facility] ED [facility] ( [facility]). 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 Reports worsening depression, neuro-vegetative symptoms of depression, worsening  in the context of trying to taper self off opiates and experiencing withdrawal symptoms. On admission reported suicidal ideations with thoughts of shooting self.  Principal Problem: <principal problem not specified> Diagnosis: Active Problems:   MDD (major depressive disorder)  Total Time spent with patient: 15 minutes  Past Psychiatric History: See admission H&P  Past Medical History:  Past Medical History:  Diagnosis Date  . Medical history non-contributory     Past Surgical History:  Procedure Laterality Date  . COLON SURGERY    . EXTERNAL FIXATION LEG Right 02/09/2013   Procedure: EXTERNAL FIXATION LEG;  Surgeon: Eulas PostJoshua P Landau, MD;  Location: [facility] OR;  Service: Orthopedics;  Laterality: Right;  . LAPAROTOMY N/A 02/09/2013   Procedure: EXPLORATORY LAPAROTOMY with small bowel resection and repair of mesentery;  Surgeon: Cherylynn RidgesJames O Wyatt, MD;  [facility];  Service: General;  Laterality: N/A;  . OPEN REDUCTION INTERNAL FIXATION (ORIF) TIBIA/FIBULA FRACTURE Right 02/17/2013   Procedure: OPEN REDUCTION INTERNAL FIXATION (ORIF) RIGHT TIBIAL PILON AND ORIF RIGHT FIBULA FRACTURE AND REMOVAL OF SYNTHESE EXFIX;  Surgeon: Toni ArthursJohn Hewitt, MD;  Location: [facility] OR;  Service: Orthopedics;   Laterality: Right;   Family History: History reviewed. No pertinent family history. Family Psychiatric  History: See admission H&P Social History:  Social History   Substance and Sexual Activity  Alcohol Use Yes     Social History   Substance and Sexual Activity  Drug Use No    Social History   Socioeconomic History  . Marital status: Legally Separated    Spouse name: Not on file  . Number of children: Not on file  . Years of education: Not on file  . Highest education level: Not on file  Occupational History  . Not on file  Social Needs  . Financial resource strain: Not on file  . Food insecurity    Worry: Not on file    Inability: Not on file  . Transportation needs    Medical: Not on file    Non-medical: Not on file  Tobacco Use  . Smoking status: Current Every Day Smoker    Types: Cigarettes  . Smokeless tobacco: Never Used  Substance and Sexual Activity  . Alcohol use: Yes  . Drug use: No  . Sexual activity: Not on file  Lifestyle  . Physical activity    Days per week: Not on file    Minutes per session: Not on file  . Stress: Not on file  Relationships  . Social Musicianconnections    Talks on phone: Not on file    Gets together: Not on file    Attends religious service: Not on file    Active member of club or organization: Not on file    Attends meetings of clubs or organizations: Not on file    Relationship status: Not on file  Other Topics Concern  .  Not on file  Social History Narrative  . Not on file   Additional Social History:    Pain Medications: see MAR Prescriptions: see MAR Over the Counter: see MAR History of alcohol / drug use?: Yes Name of Substance 1: Opiates 1 - Age of First Use: not assessed 1 - Amount (size/oz): not assessed 1 - Frequency: UTA 1 - Duration: 3-4 months 1 - Last Use / Amount: not assessed                  Sleep: Fair  Appetite:  Fair  Current Medications: Current Facility-Administered Medications   Medication Dose Route Frequency Provider Last Rate Last Dose  . acetaminophen (TYLENOL) tablet 650 mg  650 mg Oral Q6H PRN Denzil Magnusonhomas, Lashunda, NP   650 mg at 06/03/19 2146  . alum & mag hydroxide-simeth (MAALOX/MYLANTA) 200-200-20 MG/5ML suspension 30 mL  30 mL Oral Q4H PRN Denzil Magnusonhomas, Lashunda, NP      . cloNIDine (CATAPRES) tablet 0.1 mg  0.1 mg Oral QID , Rockey Situ A, MD   0.1 mg at 06/04/19 [id number]   Followed by  . cloNIDine (CATAPRES) tablet 0.1 mg  0.1 mg Oral BH-qamhs , Rockey Situ A, MD       Followed by  . [START ON 06/07/2019] cloNIDine (CATAPRES) tablet 0.1 mg  0.1 mg Oral QAC breakfast ,  A, MD      . dicyclomine (BENTYL) tablet 20 mg  20 mg Oral Q6H PRN , Rockey Situ A, MD   20 mg at 06/04/19 0936  . hydrOXYzine (ATARAX/VISTARIL) tablet 25 mg  25 mg Oral Q6H PRN , Rockey Situ A, MD   25 mg at 06/04/19 0936  . loperamide (IMODIUM) capsule 2-4 mg  2-4 mg Oral PRN , Rockey Situ A, MD      . methocarbamol (ROBAXIN) tablet 500 mg  500 mg Oral Q8H PRN , Rockey Situ A, MD   500 mg at 06/03/19 1942  . naproxen (NAPROSYN) tablet 500 mg  500 mg Oral BID PRN , Rockey Situ A, MD   500 mg at 06/04/19 0936  . nicotine (NICODERM CQ - dosed in mg/24 hours) patch 21 mg  21 mg Transdermal Daily , Rockey Situ A, MD   21 mg at 06/04/19 0937  . ondansetron (ZOFRAN-ODT) disintegrating tablet 4 mg  4 mg Oral Q6H PRN , Rockey Situ A, MD      . sertraline (ZOLOFT) tablet 50 mg  50 mg Oral Daily , Rockey Situ A, MD   50 mg at 06/04/19 [id number]    Lab Results: No results found for this or any previous visit (from the past 48 hour(s)).  Blood Alcohol level:  No results found for: [facility]  Metabolic Disorder Labs: No results found for: HGBA1C, MPG No results found for: PROLACTIN No results found for: CHOL, TRIG, HDL, CHOLHDL, VLDL, LDLCALC  Physical Findings: AIMS: Facial and Oral Movements Muscles of Facial Expression: None, normal Lips and Perioral Area: None, normal Jaw: None,  normal Tongue: None, normal,Extremity Movements Upper (arms, wrists, hands, fingers): None, normal Lower (legs, knees, ankles, toes): None, normal, Trunk Movements Neck, shoulders, hips: None, normal, Overall Severity Severity of abnormal movements (highest score from questions above): None, normal Incapacitation due to abnormal movements: None, normal Patient's awareness of abnormal movements (rate only patient's report): No Awareness, Dental Status Current problems with teeth and/or dentures?: No Does patient usually wear dentures?: No  CIWA:    COWS:  COWS Total Score: 5  Musculoskeletal: Strength & Muscle Tone: within normal limits Gait & Station:  normal Patient leans: N/A  Psychiatric Specialty Exam: Physical Exam  Nursing note and vitals reviewed. Constitutional: He is oriented to person, place, and time. He appears well-developed and well-nourished.  Cardiovascular: Normal rate.  Respiratory: Effort normal.  Neurological: He is alert and oriented to person, place, and time.    Review of Systems  Constitutional: Positive for diaphoresis and malaise/fatigue. Negative for chills and fever.  Respiratory: Negative for cough and shortness of breath.   Cardiovascular: Negative for chest pain.  Gastrointestinal: Positive for abdominal pain and diarrhea. Negative for nausea and vomiting.  Musculoskeletal: Positive for myalgias.  Neurological: Positive for headaches. Negative for tremors.  Psychiatric/Behavioral: Positive for depression and substance abuse. Negative for hallucinations and suicidal ideas. The patient is not nervous/anxious and does not have insomnia.     Blood pressure 116/81, pulse (!) 55, temperature 98.9 F (37.2 C), temperature source Oral, resp. rate 20, height 6' (1.829 m), weight 84.2 kg, SpO2 100 %.Body mass index is 25.17 kg/m.  General Appearance: Disheveled  Eye Contact:  Fair  Speech:  Slow  Volume:  Decreased  Mood:  Anxious  Affect:  Congruent   Thought Process:  Coherent  Orientation:  Full (Time, Place, and Person)  Thought Content:  Logical  Suicidal Thoughts:  No  Homicidal Thoughts:  No  Memory:  Immediate;   Fair Recent;   Fair  Judgement:  Fair  Insight:  Fair  Psychomotor Activity:  Decreased  Concentration:  Concentration: Good  Recall:  [place] of Knowledge:  Fair  Language:  Good  Akathisia:  No  Handed:  Right  AIMS (if indicated):     Assets:  Communication Skills Desire for Improvement Resilience  ADL's:  Intact  Cognition:  WNL  Sleep:  Number of Hours: 6.75     Treatment Plan Summary: Daily contact with patient to assess and evaluate symptoms and progress in treatment and Medication management   Continue inpatient hospitalization.  Continue clonidine COWS protocol for heroin withdrawal Continue Zoloft 50 mg PO daily for mood Continue Vistaril 25 mg PO TID PRN anxiety  Patient will participate in the therapeutic group milieu.  Discharge disposition in progress.   Connye Burkitt, NP 06/04/2019, 12:52 PM   Attest to NP Progress Note

## 2019-06-04 NOTE — Progress Notes (Signed)
[facility] Group Notes:  (Nursing/MHT/Case Management/Adjunct)  Date:  06/04/2019  Time:  2030  Type of Therapy:  wrap up group  Participation Level:  Active  Participation Quality:  Appropriate, Attentive, Sharing and Supportive  Affect:  Blunted  Cognitive:  Appropriate  Insight:  Improving  Engagement in Group:  Engaged  Modes of Intervention:  Clarification, Education and Support  Summary of Progress/Problems:  Shellia Cleverly 06/04/2019, 9:10 PM

## 2019-06-04 NOTE — [facility] Group Notes (Signed)
[facility] Group Notes:  (Nursing)  Date:  06/04/2019  Time: 200 PM Type of Therapy:  Nurse Education  Participation Level:  Active  Participation Quality:  Appropriate  Affect:  Appropriate  Cognitive:  Appropriate  Insight:  Appropriate  Engagement in Group:  Engaged  Modes of Intervention:  Discussion and Education  Summary of Progress/Problems: Group played a non competitive Retail banker game that fosters listening skills as well as self expression Waymond Cera 06/04/2019, 7:09 PM

## 2019-06-04 NOTE — B[facility] Group Notes (Signed)
LCSW Group Therapy Note  06/04/2019   10:00-11:00am   Type of Therapy and Topic:  Group Therapy: Anger Cues and Responses  Participation Level:  Did Not Attend   Description of Group:   In this group, patients learned how to recognize the physical, cognitive, emotional, and behavioral responses they have to anger-provoking situations.  They identified a recent time they became angry and how they reacted.  They analyzed how their reaction was possibly beneficial and how it was possibly unhelpful.  The group discussed a variety of healthier coping skills that could help with such a situation in the future.  Deep breathing was practiced briefly.  Therapeutic Goals: 1. Patients will remember their last incident of anger and how they felt emotionally and physically, what their thoughts were at the time, and how they behaved. 2. Patients will identify how their behavior at that time worked for them, as well as how it worked against them. 3. Patients will explore possible new behaviors to use in future anger situations. 4. Patients will learn that anger itself is normal and cannot be eliminated, and that healthier reactions can assist with resolving conflict rather than worsening situations.  Summary of Patient Progress:  The patient  Did not attend  Therapeutic Modalities:   Cognitive Behavioral Therapy  Rolanda Jay

## 2019-06-04 NOTE — Progress Notes (Signed)
D. Pt presents with an anxious affect/mood, calm and cooperative behavior- remained in bed for most of the am- pt voices complaints of moderate withdrawal symptoms (chills, achy, stomach cramps)  Pt currently denies SI/HI and AVH  A. Labs and vitals monitored. Pt compliant with medications. Pt supported emotionally and encouraged to express concerns and ask questions.   R. Pt remains safe with 15 minute checks. Will continue POC.

## 2019-06-04 NOTE — Progress Notes (Signed)
[place] NOVEL CORONAVIRUS (COVID-19) DAILY CHECK-OFF SYMPTOMS - answer yes or no to each - every day NO YES  Have you had a fever in the past 24 hours?  . Fever (Temp > 37.80C / 100F) X   Have you had any of these symptoms in the past 24 hours? . New Cough .  Sore Throat  .  Shortness of Breath .  Difficulty Breathing .  Unexplained Body Aches   X   Have you had any one of these symptoms in the past 24 hours not related to allergies?   . Runny Nose .  Nasal Congestion .  Sneezing   X   If you have had runny nose, nasal congestion, sneezing in the past 24 hours, has it worsened?  X   EXPOSURES - check yes or no X   Have you traveled outside the state in the past 14 days?  X   Have you been in contact with someone with a confirmed diagnosis of COVID-19 or PUI in the past 14 days without wearing appropriate PPE?  X   Have you been living in the same home as a person with confirmed diagnosis of COVID-19 or a PUI (household contact)?    X   Have you been diagnosed with COVID-19?    X              What to do next: Answered NO to all: Answered YES to anything:   Proceed with unit schedule Follow the [facility] Inpatient Flowsheet.   

## 2019-06-05 MED ORDER — MIRTAZAPINE 7.5 MG PO TABS
7.5000 mg | ORAL_TABLET | Freq: Every day | ORAL | Status: DC
  Administered 2019-06-05: 21:00:00 7.5 mg via ORAL
  Filled 2019-06-05 (×2): qty 1

## 2019-06-05 NOTE — [facility] Suicide Risk Assessment (Signed)
[place] INPATIENT:  Family/Significant Other Suicide Prevention Education  Suicide Prevention Education:  Patient Refusal for Family/Significant Other Suicide Prevention Education: The patient [name] has refused to provide written consent for family/significant other to be provided Family/Significant Other Suicide Prevention Education during admission and/or prior to discharge.  Physician notified.  Rolanda Jay 06/05/2019, 3:57 PM

## 2019-06-05 NOTE — Progress Notes (Signed)
[place] NOVEL CORONAVIRUS (COVID-19) DAILY CHECK-OFF SYMPTOMS - answer yes or no to each - every day NO YES  Have you had a fever in the past 24 hours?  . Fever (Temp > 37.80C / 100F) X   Have you had any of these symptoms in the past 24 hours? . New Cough .  Sore Throat  .  Shortness of Breath .  Difficulty Breathing .  Unexplained Body Aches   X   Have you had any one of these symptoms in the past 24 hours not related to allergies?   . Runny Nose .  Nasal Congestion .  Sneezing   X   If you have had runny nose, nasal congestion, sneezing in the past 24 hours, has it worsened?  X   EXPOSURES - check yes or no X   Have you traveled outside the state in the past 14 days?  X   Have you been in contact with someone with a confirmed diagnosis of COVID-19 or PUI in the past 14 days without wearing appropriate PPE?  X   Have you been living in the same home as a person with confirmed diagnosis of COVID-19 or a PUI (household contact)?    X   Have you been diagnosed with COVID-19?    X              What to do next: Answered NO to all: Answered YES to anything:   Proceed with unit schedule Follow the [facility] Inpatient Flowsheet.   

## 2019-06-05 NOTE — Progress Notes (Signed)
D: Pt denies SI/HI/AVH. Pt is pleasant and cooperative. Pt stated he was ok, early withdrawal sx today, but was a little better, and some triggers he worked through [place]: Pt was offered support and encouragement. Pt was given scheduled medications. Pt was encourage to attend groups. Q 15 minute checks were done for safety.  R:Pt attends groups and interacts well with peers and staff. Pt is taking medication. Pt has no complaints.Pt receptive to treatment and safety maintained on unit.  Problem: Education: Goal: Mental status will improve Outcome: Progressing   Problem: Education: Goal: Verbalization of understanding the information provided will improve Outcome: Progressing

## 2019-06-05 NOTE — Progress Notes (Signed)
D: Pt stated he felt better A: Pt was offered support and encouragement. Pt was given scheduled medications. Pt was encourage to attend groups. Q 15 minute checks were done for safety.  R: safety maintained on unit.  Problem: Education: Goal: Emotional status will improve Outcome: Progressing   Problem: Education: Goal: Mental status will improve Outcome: Progressing

## 2019-06-05 NOTE — [facility] Group Notes (Signed)
[place] LCSW Group Therapy Note  Date/Time:  06/05/2019 9:00-10:00 or 10:00-11:00AM  Type of Therapy and Topic:  Group Therapy:  Healthy and Unhealthy Supports  Participation Level:  Active   Description of Group:  Patients in this group were introduced to the idea of adding a variety of healthy supports to address the various needs in their lives.Patients discussed what additional healthy supports could be helpful in their recovery and wellness after discharge in order to prevent future hospitalizations.   An emphasis was placed on using counselor, doctor, therapy groups, 12-step groups, and problem-specific support groups to expand supports.  They also worked as a group on developing a specific plan for several patients to deal with unhealthy supports through [place], psychoeducation with loved ones, and even termination of relationships.   Therapeutic Goals:   1)  discuss importance of adding supports to stay well once out of the hospital  2)  compare healthy versus unhealthy supports and identify some examples of each  3)  generate ideas and descriptions of healthy supports that can be added  4)  offer mutual support about how to address unhealthy supports  5)  encourage active participation in and adherence to discharge plan    Summary of Patient Progress:  The patient stated that current healthy supports in his  life are bos and his children while current unhealthy supports include "everybody else"  The patient expressed a willingness to add his church and it's recovery program as supports  to help in his recovery journey.   Therapeutic Modalities:   Motivational Interviewing Brief Solution-Focused Therapy  Rolanda Jay
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
## 2019-06-05 NOTE — Progress Notes (Addendum)
[facility]  06/05/2019 9:54 AM Juan QuamDaniel Edmonston III  MRN:  [id number] Subjective:  "I'm ok."  [name] found lying in bed. He has been isolating in bed this morning. He complains of continued difficulty sleeping with trazodone, with 5 hours of sleep recorded. He also reports withdrawal symptoms- tremors, myalgias, fatigue, anxiety. Clonidine detox protocol continues. Denies SI/HI/AVH.   From admission H&P: 43 year old male, [facility] 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 Reports worsening depression, neuro-vegetative symptoms of depression, worseningin the context of trying to taper self off opiates and experiencing withdrawal symptoms. On admission reported suicidal ideations with thoughts of shooting self.  Principal Problem: <principal problem not specified> Diagnosis: Active Problems:   MDD (major depressive disorder)  Total Time spent with patient: 15 minutes  Past Psychiatric History: See admission H&P  Past Medical History:  Past Medical History:  Diagnosis Date  . Medical history non-contributory     Past Surgical History:  Procedure Laterality Date  . COLON SURGERY    . EXTERNAL FIXATION LEG Right 02/09/2013   Procedure: EXTERNAL FIXATION LEG;  Surgeon: Eulas PostJoshua P Landau, MD;  Location: [facility] OR;  Service: Orthopedics;  Laterality: Right;  . LAPAROTOMY N/A 02/09/2013   Procedure: EXPLORATORY LAPAROTOMY with small bowel resection and repair of mesentery;  Surgeon: Cherylynn RidgesJames O Wyatt, MD;  [facility];  Service: General;  Laterality: N/A;  . OPEN REDUCTION INTERNAL FIXATION (ORIF) TIBIA/FIBULA FRACTURE Right 02/17/2013   Procedure: OPEN REDUCTION INTERNAL FIXATION (ORIF) RIGHT TIBIAL PILON AND ORIF RIGHT FIBULA FRACTURE AND REMOVAL OF SYNTHESE EXFIX;  Surgeon: Toni ArthursJohn Hewitt, MD;  Location: [facility] OR;  Service: Orthopedics;  Laterality: Right;   Family History: History  reviewed. No pertinent family history. Family Psychiatric  History: See admission H&P Social History:  Social History   Substance and Sexual Activity  Alcohol Use Yes     Social History   Substance and Sexual Activity  Drug Use No    Social History   Socioeconomic History  . Marital status: Legally Separated    Spouse name: Not on file  . Number of children: Not on file  . Years of education: Not on file  . Highest education level: Not on file  Occupational History  . Not on file  Social Needs  . Financial resource strain: Not on file  . Food insecurity    Worry: Not on file    Inability: Not on file  . Transportation needs    Medical: Not on file    Non-medical: Not on file  Tobacco Use  . Smoking status: Current Every Day Smoker    Types: Cigarettes  . Smokeless tobacco: Never Used  Substance and Sexual Activity  . Alcohol use: Yes  . Drug use: No  . Sexual activity: Not on file  Lifestyle  . Physical activity    Days per week: Not on file    Minutes per session: Not on file  . Stress: Not on file  Relationships  . Social Musicianconnections    Talks on phone: Not on file    Gets together: Not on file    Attends religious service: Not on file    Active member of club or organization: Not on file    Attends meetings of clubs or organizations: Not on file    Relationship status: Not on file  Other Topics Concern  . Not on  file  Social History Narrative  . Not on file   Additional Social History:    Pain Medications: see MAR Prescriptions: see MAR Over the Counter: see MAR History of alcohol / drug use?: Yes Name of Substance 1: Opiates 1 - Age of First Use: not assessed 1 - Amount (size/oz): not assessed 1 - Frequency: UTA 1 - Duration: 3-4 months 1 - Last Use / Amount: not assessed                  Sleep: Poor  Appetite:  Good  Current Medications: Current Facility-Administered Medications  Medication Dose Route Frequency Provider Last Rate  Last Dose  . acetaminophen (TYLENOL) tablet 650 mg  650 mg Oral Q6H PRN Mordecai Maes, NP   650 mg at 06/03/19 2146  . alum & mag hydroxide-simeth (MAALOX/MYLANTA) 200-200-20 MG/5ML suspension 30 mL  30 mL Oral Q4H PRN Mordecai Maes, NP      . cloNIDine (CATAPRES) tablet 0.1 mg  0.1 mg Oral BH-qamhs Perl Kerney, Myer Peer, MD   0.1 mg at 06/05/19 0857   Followed by  . [START ON 06/07/2019] cloNIDine (CATAPRES) tablet 0.1 mg  0.1 mg Oral QAC breakfast Ravis Herne A, MD      . dicyclomine (BENTYL) tablet 20 mg  20 mg Oral Q6H PRN Marios Gaiser, Myer Peer, MD   20 mg at 06/04/19 2200  . hydrOXYzine (ATARAX/VISTARIL) tablet 25 mg  25 mg Oral Q6H PRN Minh Jasper, Myer Peer, MD   25 mg at 06/05/19 0857  . loperamide (IMODIUM) capsule 2-4 mg  2-4 mg Oral PRN Cashmere Harmes, Myer Peer, MD      . methocarbamol (ROBAXIN) tablet 500 mg  500 mg Oral Q8H PRN Raynaldo Falco, Myer Peer, MD   500 mg at 06/04/19 2159  . naproxen (NAPROSYN) tablet 500 mg  500 mg Oral BID PRN Charmayne Odell, Myer Peer, MD   500 mg at 06/04/19 2159  . nicotine (NICODERM CQ - dosed in mg/24 hours) patch 21 mg  21 mg Transdermal Daily Maxamillion Banas, Myer Peer, MD   21 mg at 06/05/19 0858  . ondansetron (ZOFRAN-ODT) disintegrating tablet 4 mg  4 mg Oral Q6H PRN Okechukwu Regnier, Myer Peer, MD      . sertraline (ZOLOFT) tablet 50 mg  50 mg Oral Daily Jahi Roza, Myer Peer, MD   50 mg at 06/05/19 0857  . traZODone (DESYREL) tablet 100 mg  100 mg Oral QHS,MR X 1 Laverle Hobby, PA-C   100 mg at 06/04/19 2307    Lab Results: No results found for this or any previous visit (from the past 48 hour(s)).  Blood Alcohol level:  No results found for: [facility]  Metabolic Disorder Labs: No results found for: HGBA1C, MPG No results found for: PROLACTIN No results found for: CHOL, TRIG, HDL, CHOLHDL, VLDL, LDLCALC  Physical Findings: AIMS: Facial and Oral Movements Muscles of Facial Expression: None, normal Lips and Perioral Area: None, normal Jaw: None, normal Tongue: None, normal,Extremity  Movements Upper (arms, wrists, hands, fingers): None, normal Lower (legs, knees, ankles, toes): None, normal, Trunk Movements Neck, shoulders, hips: None, normal, Overall Severity Severity of abnormal movements (highest score from questions above): None, normal Incapacitation due to abnormal movements: None, normal Patient's awareness of abnormal movements (rate only patient's report): No Awareness, Dental Status Current problems with teeth and/or dentures?: No Does patient usually wear dentures?: No  CIWA:    COWS:  COWS Total Score: 1  Musculoskeletal: Strength & Muscle Tone: within normal limits Gait & Station: normal  Patient leans: N/A  Psychiatric Specialty Exam: Physical Exam  Nursing note and vitals reviewed. Constitutional: He is oriented to person, place, and time. He appears well-developed and well-nourished.  Cardiovascular: Normal rate.  Respiratory: Effort normal.  Neurological: He is alert and oriented to person, place, and time.    Review of Systems  Constitutional: Positive for chills, diaphoresis and malaise/fatigue. Negative for fever and weight loss.  Respiratory: Negative for cough and shortness of breath.   Cardiovascular: Negative for chest pain.  Gastrointestinal: Negative for abdominal pain, diarrhea, nausea and vomiting.  Musculoskeletal: Positive for myalgias.  Neurological: Positive for tremors. Negative for sensory change and headaches.  Psychiatric/Behavioral: Positive for depression and substance abuse. Negative for hallucinations and suicidal ideas. The patient is nervous/anxious and has insomnia.     Blood pressure 114/90, pulse 80, temperature 98.4 F (36.9 C), temperature source Oral, resp. rate 20, height 6' (1.829 m), weight 84.2 kg, SpO2 100 %.Body mass index is 25.17 kg/m.  General Appearance: Disheveled  Eye Contact:  Fair  Speech:  Slow  Volume:  Decreased  Mood:  Anxious  Affect:  Congruent  Thought Process:  Coherent  Orientation:   Full (Time, Place, and Person)  Thought Content:  Logical  Suicidal Thoughts:  No  Homicidal Thoughts:  No  Memory:  Immediate;   Good Recent;   Good  Judgement:  Intact  Insight:  Fair  Psychomotor Activity:  Decreased  Concentration:  Concentration: Fair  Recall:  Good  Fund of Knowledge:  Fair  Language:  Fair  Akathisia:  No  Handed:  Right  AIMS (if indicated):     Assets:  Communication Skills Desire for Improvement Resilience  ADL's:  Intact  Cognition:  WNL  Sleep:  Number of Hours: 5     Treatment Plan Summary: Daily contact with patient to assess and evaluate symptoms and progress in treatment and Medication management   Continue inpatient hospitalization.  Continue clonidine COWS protocol for heroin withdrawal Discontinue Zoloft 50 mg PO daily for mood Start Remeron 7.5 mg PO QHS for mood/sleep Continue Vistaril 25 mg PO TID PRN anxiety  Patient will participate in the therapeutic group milieu.  Discharge disposition in progress.   Aldean BakerJanet E Sykes, NP 06/05/2019, 9:54 AM   Attest to NP Progress Note

## 2019-06-05 NOTE — Progress Notes (Addendum)
D. Pt presents with a depressed affect/ anxious mood- calm and cooperative behavior- endorses mild withdrawal symptoms this am (aches, stomach cramps, anxiety).  Pt visible in the milieu interacting appropriately with peers and staff.  Pt currently denies SI/HI and AVH  A. Labs and vitals monitored. Pt compliant with medications. Pt supported emotionally and encouraged to express concerns and ask questions.   R. Pt remains safe with 15 minute checks. Will continue POC.

## 2019-06-06 MED ORDER — TEMAZEPAM 15 MG PO CAPS
45.0000 mg | ORAL_CAPSULE | Freq: Every day | ORAL | Status: DC
  Administered 2019-06-06 – 2019-06-07 (×2): 45 mg via ORAL
  Filled 2019-06-06 (×2): qty 3

## 2019-06-06 MED ORDER — METHOCARBAMOL 750 MG PO TABS
750.0000 mg | ORAL_TABLET | Freq: Three times a day (TID) | ORAL | Status: DC
  Administered 2019-06-06 – 2019-06-08 (×5): 750 mg via ORAL
  Filled 2019-06-06 (×12): qty 1

## 2019-06-06 MED ORDER — GABAPENTIN 300 MG PO CAPS
300.0000 mg | ORAL_CAPSULE | Freq: Three times a day (TID) | ORAL | Status: DC
  Administered 2019-06-06 – 2019-06-07 (×4): 300 mg via ORAL
  Filled 2019-06-06 (×7): qty 1

## 2019-06-06 MED ORDER — SERTRALINE HCL 50 MG PO TABS
50.0000 mg | ORAL_TABLET | Freq: Every day | ORAL | Status: DC
  Administered 2019-06-06 – 2019-06-08 (×3): 50 mg via ORAL
  Filled 2019-06-06 (×4): qty 1
  Filled 2019-06-06: qty 7
  Filled 2019-06-06: qty 1

## 2019-06-06 NOTE — Progress Notes (Addendum)
Spiritual care group on grief and loss facilitated by chaplain Jerene Pitch  Group Goal:  Support / Education around grief and loss Members engage in facilitated group support and psycho-social education.  Group Description:  Following introductions and group rules, group members engaged in facilitated group dialog and support around topic of loss, with particular support around experiences of loss in their lives. Group Identified types of loss (relationships / self / things) and identified patterns, circumstances, and changes that precipitate losses. Reflected on thoughts / feelings around loss, normalized grief responses, and recognized variety in grief experience. Patient Progress: Present throughout group.  Alert and engaged in facilitated discussion.  Spoke with group members about the narratives he carries from his father which tell him that he is not supposed to seek / need support.  Received affirmation from group around engagement in group process.  [name] spoke with group about changes in his life since his children had left home - especially worried about restoring relationship with 9 year old son.

## 2019-06-06 NOTE — Care Management (Signed)
CMA sent referral to [facility].   CMA will follow up with Admissions and notify CSW.     Nizhoni Parlow Care Management Assistant  Email:[email or web address] Office: [phone]

## 2019-06-06 NOTE — Care Management (Signed)
Per Shayla with [facility], at this time there are currently no beds. Patient can follow up with [facility] after discharge.  CMA will notify CSW.     Jennavecia Schwier Care Management Assistant  Email:[email or web address] Office: [phone]

## 2019-06-06 NOTE — Progress Notes (Signed)
[facility]  06/06/2019 1:47 PM Andrew QuamDaniel Birky Lucas  MRN:  [id number] Subjective:    Patient continues to report some withdrawal symptoms can contract here focused on discharge but also wanting some meaningful rehab upon discharge.  Complaining of cramping craving and profound insomnia.  Also complaining of dysthymia Principal Problem: <principal problem not specified> Diagnosis: Active Problems:   MDD (major depressive disorder)  Total Time spent with patient: 20 minutes  Past Medical History:  Past Medical History:  Diagnosis Date  . Medical history non-contributory     Past Surgical History:  Procedure Laterality Date  . COLON SURGERY    . EXTERNAL FIXATION LEG Right 02/09/2013   Procedure: EXTERNAL FIXATION LEG;  Surgeon: Eulas PostJoshua P Landau, MD;  Location: [facility] OR;  Service: Orthopedics;  Laterality: Right;  . LAPAROTOMY N/A 02/09/2013   Procedure: EXPLORATORY LAPAROTOMY with small bowel resection and repair of mesentery;  Surgeon: Cherylynn RidgesJames O Wyatt, MD;  [facility];  Service: General;  Laterality: N/A;  . OPEN REDUCTION INTERNAL FIXATION (ORIF) TIBIA/FIBULA FRACTURE Right 02/17/2013   Procedure: OPEN REDUCTION INTERNAL FIXATION (ORIF) RIGHT TIBIAL PILON AND ORIF RIGHT FIBULA FRACTURE AND REMOVAL OF SYNTHESE EXFIX;  Surgeon: Toni ArthursJohn Hewitt, MD;  Location: [facility] OR;  Service: Orthopedics;  Laterality: Right;   Family History: History reviewed. No pertinent family history. Family Psychiatric  History: see eval Social History:  Social History   Substance and Sexual Activity  Alcohol Use Yes     Social History   Substance and Sexual Activity  Drug Use No    Social History   Socioeconomic History  . Marital status: Legally Separated    Spouse name: Not on file  . Number of children: Not on file  . Years of education: Not on file  . Highest education level: Not on file  Occupational History  . Not on file  Social Needs  . Financial resource strain: Not on file  . Food  insecurity    Worry: Not on file    Inability: Not on file  . Transportation needs    Medical: Not on file    Non-medical: Not on file  Tobacco Use  . Smoking status: Current Every Day Smoker    Types: Cigarettes  . Smokeless tobacco: Never Used  Substance and Sexual Activity  . Alcohol use: Yes  . Drug use: No  . Sexual activity: Not on file  Lifestyle  . Physical activity    Days per week: Not on file    Minutes per session: Not on file  . Stress: Not on file  Relationships  . Social Musicianconnections    Talks on phone: Not on file    Gets together: Not on file    Attends religious service: Not on file    Active member of club or organization: Not on file    Attends meetings of clubs or organizations: Not on file    Relationship status: Not on file  Other Topics Concern  . Not on file  Social History Narrative  . Not on file   Additional Social History:    Pain Medications: see MAR Prescriptions: see MAR Over the Counter: see MAR History of alcohol / drug use?: Yes Name of Substance 1: Opiates 1 - Age of First Use: not assessed 1 - Amount (size/oz): not assessed 1 - Frequency: UTA 1 - Duration: 3-4 months 1 - Last Use / Amount: not assessed  Sleep: Good  Appetite:  Good  Current Medications: Current Facility-Administered Medications  Medication Dose Route Frequency Provider Last Rate Last Dose  . acetaminophen (TYLENOL) tablet 650 mg  650 mg Oral Q6H PRN Denzil Magnusonhomas, Lashunda, NP   650 mg at 06/03/19 2146  . alum & mag hydroxide-simeth (MAALOX/MYLANTA) 200-200-20 MG/5ML suspension 30 mL  30 mL Oral Q4H PRN Denzil Magnusonhomas, Lashunda, NP      . Melene Muller[START ON 06/07/2019] cloNIDine (CATAPRES) tablet 0.1 mg  0.1 mg Oral QAC breakfast Cobos, Fernando A, MD      . dicyclomine (BENTYL) tablet 20 mg  20 mg Oral Q6H PRN Cobos, Rockey SituFernando A, MD   20 mg at 06/06/19 1122  . gabapentin (NEURONTIN) capsule 300 mg  300 mg Oral TID Malvin JohnsFarah, Inocente Krach, MD      . hydrOXYzine  (ATARAX/VISTARIL) tablet 25 mg  25 mg Oral Q6H PRN Cobos, Rockey SituFernando A, MD   25 mg at 06/06/19 0911  . loperamide (IMODIUM) capsule 2-4 mg  2-4 mg Oral PRN Cobos, Rockey SituFernando A, MD      . methocarbamol (ROBAXIN) tablet 750 mg  750 mg Oral TID Malvin JohnsFarah, Britanee Vanblarcom, MD      . naproxen (NAPROSYN) tablet 500 mg  500 mg Oral BID PRN Cobos, Rockey SituFernando A, MD   500 mg at 06/06/19 1122  . nicotine (NICODERM CQ - dosed in mg/24 hours) patch 21 mg  21 mg Transdermal Daily Cobos, Rockey SituFernando A, MD   21 mg at 06/06/19 0928  . ondansetron (ZOFRAN-ODT) disintegrating tablet 4 mg  4 mg Oral Q6H PRN Cobos, Rockey SituFernando A, MD      . sertraline (ZOLOFT) tablet 50 mg  50 mg Oral Daily Malvin JohnsFarah, Sheela Mcculley, MD      . temazepam (RESTORIL) capsule 45 mg  45 mg Oral QHS Malvin JohnsFarah, Barkley Kratochvil, MD        Lab Results: No results found for this or any previous visit (from the past 48 hour(s)).  Blood Alcohol level:  No results found for: [facility]  Metabolic Disorder Labs: No results found for: HGBA1C, MPG No results found for: PROLACTIN No results found for: CHOL, TRIG, HDL, CHOLHDL, VLDL, LDLCALC  Physical Findings: AIMS: Facial and Oral Movements Muscles of Facial Expression: None, normal Lips and Perioral Area: None, normal Jaw: None, normal Tongue: None, normal,Extremity Movements Upper (arms, wrists, hands, fingers): None, normal Lower (legs, knees, ankles, toes): None, normal, Trunk Movements Neck, shoulders, hips: None, normal, Overall Severity Severity of abnormal movements (highest score from questions above): None, normal Incapacitation due to abnormal movements: None, normal Patient's awareness of abnormal movements (rate only patient's report): No Awareness, Dental Status Current problems with teeth and/or dentures?: No Does patient usually wear dentures?: No  CIWA:    COWS:  COWS Total Score: 4  Musculoskeletal: Strength & Muscle Tone: within normal limits Gait & Station: normal Patient leans: N/A  Psychiatric Specialty  Exam: Physical Exam  ROS  Blood pressure 123/78, pulse (!) 53, temperature 98.2 F (36.8 C), temperature source Oral, resp. rate 20, height 6' (1.829 m), weight 84.2 kg, SpO2 100 %.Body mass index is 25.17 kg/m.  General Appearance: Casual  Eye Contact:  Good  Speech:  Clear and Coherent  Volume:  Normal  Mood:  Anxious and Depressed  Affect:  Appropriate  Thought Process:  Coherent and Descriptions of Associations: Intact  Orientation:  Full (Time, Place, and Person)  Thought Content:  Logical  Suicidal Thoughts:  No  Homicidal Thoughts:  No  Memory:  Recent;   Fair  Judgement:  Fair  Insight:  Fair  Psychomotor Activity:  Normal  Concentration:  Concentration: Fair  Recall:  [employer] of Knowledge:  Fair  Language:  Fair  Akathisia:  Negative  Handed:  Right  AIMS (if indicated):     Assets:  Physical Health Resilience Social Support  ADL's:  Intact  Cognition:  WNL  Sleep:  Number of Hours: 6.75     Treatment Plan Summary: Daily contact with patient to assess and evaluate symptoms and progress in treatment and Medication management continue but adjust detox measures reinstitute sertraline to prevent withdrawal discussed full treatment with team probable discharge 24 to 23 hours  Concetta Guion, MD 06/06/2019, 1:47 PM

## 2019-06-06 NOTE — Progress Notes (Signed)
Recreation Therapy Notes  Date:  6.29.20 Time: 0930 Location: [address] Dayroom  Group Topic: Stress Management  Goal Area(s) Addresses:  Patient will identify positive stress management techniques. Patient will identify benefits of using stress management post d/c.  Intervention: Stress Management  Activity :  Meditation.  LRT introduced the stress management technique of meditation.  LRT played a meditation that focused on taking on the stillness of the mountain.  Education:  Stress Management, Discharge Planning.   Education Outcome: Acknowledges Education  Clinical Observations/Feedback:  Pt did not attend group.    Victorino Sparrow, LRT/CTRS         Victorino Sparrow A 06/06/2019 10:53 AM

## 2019-06-07 MED ORDER — GABAPENTIN 300 MG PO CAPS
600.0000 mg | ORAL_CAPSULE | Freq: Three times a day (TID) | ORAL | Status: DC
  Administered 2019-06-07 – 2019-06-08 (×3): 600 mg via ORAL
  Filled 2019-06-07: qty 2
  Filled 2019-06-07: qty 42
  Filled 2019-06-07: qty 2
  Filled 2019-06-07 (×2): qty 42
  Filled 2019-06-07 (×4): qty 2

## 2019-06-07 MED ORDER — TOPIRAMATE 25 MG PO TABS
50.0000 mg | ORAL_TABLET | Freq: Two times a day (BID) | ORAL | Status: DC
  Administered 2019-06-07 – 2019-06-08 (×3): 50 mg via ORAL
  Filled 2019-06-07: qty 2
  Filled 2019-06-07 (×2): qty 28
  Filled 2019-06-07 (×5): qty 2

## 2019-06-07 MED ORDER — QUETIAPINE FUMARATE 50 MG PO TABS
50.0000 mg | ORAL_TABLET | Freq: Three times a day (TID) | ORAL | Status: DC
  Administered 2019-06-07 – 2019-06-08 (×4): 50 mg via ORAL
  Filled 2019-06-07 (×8): qty 1

## 2019-06-07 NOTE — Progress Notes (Signed)
[facility] MD Progress Note  06/07/2019 8:26 AM [name]  MRN:  [id number] Subjective:  Patient reports some right ankle pain related to past injuries and surgical procedures, he reports generalized anxiety some goes pimply flash and no nausea or vomiting and cramping is mainly lower extremity and abdominal but he is overall doing some better.  No thoughts of harming self generally cordial Principal Problem: Opiate dependence and withdrawal  diagnosis: Active Problems:   MDD (major depressive disorder)  Total Time spent with patient: 20 minutes   Past Medical History:  Past Medical History:  Diagnosis Date  . Medical history non-contributory     Past Surgical History:  Procedure Laterality Date  . COLON SURGERY    . EXTERNAL FIXATION LEG Right 02/09/2013   Procedure: EXTERNAL FIXATION LEG;  Surgeon: Johnny Bridge, MD;  Location: [place];  Service: Orthopedics;  Laterality: Right;  . LAPAROTOMY N/A 02/09/2013   Procedure: EXPLORATORY LAPAROTOMY with small bowel resection and repair of mesentery;  Surgeon: Gwenyth Ober, MD;  Location: [place];  Service: General;  Laterality: N/A;  . OPEN REDUCTION INTERNAL FIXATION (ORIF) TIBIA/FIBULA FRACTURE Right 02/17/2013   Procedure: OPEN REDUCTION INTERNAL FIXATION (ORIF) RIGHT TIBIAL PILON AND ORIF RIGHT FIBULA FRACTURE AND REMOVAL OF SYNTHESE EXFIX;  Surgeon: Wylene Simmer, MD;  Location: [place];  Service: Orthopedics;  Laterality: Right;   Family History: History reviewed. No pertinent family history. Family Psychiatric  History: no new Social History:  Social History   Substance and Sexual Activity  Alcohol Use Yes     Social History   Substance and Sexual Activity  Drug Use No    Social History   Socioeconomic History  . Marital status: Legally Separated    Spouse name: Not on file  . Number of children: Not on file  . Years of education: Not on file  . Highest education level: Not on file  Occupational History  . Not on file   Social Needs  . Financial resource strain: Not on file  . Food insecurity    Worry: Not on file    Inability: Not on file  . Transportation needs    Medical: Not on file    Non-medical: Not on file  Tobacco Use  . Smoking status: Current Every Day Smoker    Types: Cigarettes  . Smokeless tobacco: Never Used  Substance and Sexual Activity  . Alcohol use: Yes  . Drug use: No  . Sexual activity: Not on file  Lifestyle  . Physical activity    Days per week: Not on file    Minutes per session: Not on file  . Stress: Not on file  Relationships  . Social Herbalist on phone: Not on file    Gets together: Not on file    Attends religious service: Not on file    Active member of club or organization: Not on file    Attends meetings of clubs or organizations: Not on file    Relationship status: Not on file  Other Topics Concern  . Not on file  Social History Narrative  . Not on file   Additional Social History:    Pain Medications: see MAR Prescriptions: see MAR Over the Counter: see MAR History of alcohol / drug use?: Yes Name of Substance 1: Opiates 1 - Age of First Use: not assessed 1 - Amount (size/oz): not assessed 1 - Frequency: UTA 1 - Duration: 3-4 months 1 - Last Use /  Amount: not assessed                  Sleep: Good  Appetite:  Good  Current Medications: Current Facility-Administered Medications  Medication Dose Route Frequency Provider Last Rate Last Dose  . acetaminophen (TYLENOL) tablet 650 mg  650 mg Oral Q6H PRN Denzil Magnusonhomas, Lashunda, NP   650 mg at 06/03/19 2146  . alum & mag hydroxide-simeth (MAALOX/MYLANTA) 200-200-20 MG/5ML suspension 30 mL  30 mL Oral Q4H PRN Denzil Magnusonhomas, Lashunda, NP      . cloNIDine (CATAPRES) tablet 0.1 mg  0.1 mg Oral QAC breakfast Cobos, Fernando A, MD      . dicyclomine (BENTYL) tablet 20 mg  20 mg Oral Q6H PRN Cobos, Rockey SituFernando A, MD   20 mg at 06/06/19 1122  . gabapentin (NEURONTIN) capsule 600 mg  600 mg Oral TID  Malvin JohnsFarah, Burlon Centrella, MD      . hydrOXYzine (ATARAX/VISTARIL) tablet 25 mg  25 mg Oral Q6H PRN Cobos, Rockey SituFernando A, MD   25 mg at 06/06/19 2136  . loperamide (IMODIUM) capsule 2-4 mg  2-4 mg Oral PRN Cobos, Rockey SituFernando A, MD      . methocarbamol (ROBAXIN) tablet 750 mg  750 mg Oral TID Malvin JohnsFarah, Faustine Tates, MD   750 mg at 06/06/19 1629  . naproxen (NAPROSYN) tablet 500 mg  500 mg Oral BID PRN Cobos, Rockey SituFernando A, MD   500 mg at 06/06/19 1629  . nicotine (NICODERM CQ - dosed in mg/24 hours) patch 21 mg  21 mg Transdermal Daily Cobos, Rockey SituFernando A, MD   21 mg at 06/06/19 0928  . ondansetron (ZOFRAN-ODT) disintegrating tablet 4 mg  4 mg Oral Q6H PRN Cobos, Rockey SituFernando A, MD      . QUEtiapine (SEROQUEL) tablet 50 mg  50 mg Oral TID Malvin JohnsFarah, Joene Gelder, MD      . sertraline (ZOLOFT) tablet 50 mg  50 mg Oral Daily Malvin JohnsFarah, Marcin Holte, MD   50 mg at 06/06/19 1414  . temazepam (RESTORIL) capsule 45 mg  45 mg Oral QHS Malvin JohnsFarah, Haifa Hatton, MD   45 mg at 06/06/19 2136  . topiramate (TOPAMAX) tablet 50 mg  50 mg Oral BID Malvin JohnsFarah, Zurii Hewes, MD        Lab Results: No results found for this or any previous visit (from the past 48 hour(s)).  Blood Alcohol level:  No results found for: [facility]  Metabolic Disorder Labs: No results found for: HGBA1C, MPG No results found for: PROLACTIN No results found for: CHOL, TRIG, HDL, CHOLHDL, VLDL, LDLCALC  Physical Findings: AIMS: Facial and Oral Movements Muscles of Facial Expression: None, normal Lips and Perioral Area: None, normal Jaw: None, normal Tongue: None, normal,Extremity Movements Upper (arms, wrists, hands, fingers): None, normal Lower (legs, knees, ankles, toes): None, normal, Trunk Movements Neck, shoulders, hips: None, normal, Overall Severity Severity of abnormal movements (highest score from questions above): None, normal Incapacitation due to abnormal movements: None, normal Patient's awareness of abnormal movements (rate only patient's report): No Awareness, Dental Status Current problems with  teeth and/or dentures?: No Does patient usually wear dentures?: No  CIWA:    COWS:  COWS Total Score: 1  Musculoskeletal: Strength & Muscle Tone: within normal limits Gait & Station: normal Patient leans: N/A  Psychiatric Specialty Exam: Physical Exam  ROS  Blood pressure 134/84, pulse 64, temperature 98.1 F (36.7 C), temperature source Oral, resp. rate 20, height 6' (1.829 m), weight 84.2 kg, SpO2 100 %.Body mass index is 25.17 kg/m.  General Appearance: Casual  Eye  Contact:  Good  Speech:  Clear and Coherent  Volume:  Normal  Mood:  Euthymic  Affect:  Congruent  Thought Process:  Coherent and Descriptions of Associations: Intact  Orientation:  Full (Time, Place, and Person)  Thought Content:  Logical  Suicidal Thoughts:  No  Homicidal Thoughts:  No  Memory:  Immediate;   Good  Judgement:  Good  Insight:  Good  Psychomotor Activity:  Normal  Concentration:  Concentration: Fair  Recall:  Fair  Fund of Knowledge:  Fair  Language:  Fair  Akathisia:  Negative  Handed:  Right  AIMS (if indicated):     Assets:  Physical Health  ADL's:  Intact  Cognition:  WNL  Sleep:  Number of Hours: 6.25     Treatment Plan Summary: Daily contact with patient to assess and evaluate symptoms and progress in treatment, Medication management and Plan Add quetiapine for anxiety escalate gabapentin add topiramate add Robaxin continue detox measures continue current precautions  Luvina Poirier, MD 06/07/2019, 8:26 AM

## 2019-06-07 NOTE — Plan of Care (Signed)
Nursing Progress Note [phone]  Patient is seen visible in the milieu at times. Patient reports improving withdrawal but still complains of cravings, tremors, chills, irritability and cramping. PRNS provided with relief. Patient compliant with scheduled medications. Patient currently denies SI/HI/AVH.   Patient is educated about and provided medication per provider's orders. Patient safety maintained with q15 min safety checks and high fall risk precautions. Emotional support given, 1:1 interaction, and active listening provided. Patient encouraged to attend meals, groups, and work on treatment plan and goals. Labs, vital signs and patient behavior monitored throughout shift. Patient encouraged to wear mask when in the milieu and is educated about coronavirus infection control precautions.  Patient refusing to wear mask on the unit but is compliant to wear mask off unit. Patient contracts for safety with staff. Patient remains safe on the unit at this time and agrees to come to staff with any issues/concerns. Patient is interacting with peers appropriately on the unit. Will continue to support and monitor.   Patient's self-inventory sheet Rated Energy Level  Normal  Rated Sleep  Good  Rated Appetite  Good  Rated Anxiety (0-10)  7  Rated Hopelessness (0-10)  3  Rated Depression (0-10)  8  Daily Goal  "set up after care plan"  Any Additional Comments:  N/A     Problem: Education: Goal: Knowledge of [facility] Education information/materials will improve Outcome: Progressing   Problem: Activity: Goal: Interest or engagement in activities will improve Outcome: Progressing Goal: Sleeping patterns will improve Outcome: Progressing   Problem: Health Behavior/Discharge Planning: Goal: Compliance with treatment plan for underlying cause of condition will improve Outcome: Progressing   Problem: Physical Regulation: Goal: Ability to maintain clinical measurements within normal  limits will improve Outcome: Progressing   Problem: Safety: Goal: Periods of time without injury will increase Outcome: Progressing   [place] NOVEL CORONAVIRUS (COVID-19) DAILY CHECK-OFF SYMPTOMS - answer yes or no to each - every day NO YES  Have you had a fever in the past 24 hours?  Fever (Temp > 37.80C / 100F) X   Have you had any of these symptoms in the past 24 hours? New Cough  Sore Throat   Shortness of Breath  Difficulty Breathing  Unexplained Body Aches   X   Have you had any one of these symptoms in the past 24 hours not related to allergies?   Runny Nose  Nasal Congestion  Sneezing   X   If you have had runny nose, nasal congestion, sneezing in the past 24 hours, has it worsened?  X   EXPOSURES - check yes or no X   Have you traveled outside the state in the past 14 days?  X   Have you been in contact with someone with a confirmed diagnosis of COVID-19 or PUI in the past 14 days without wearing appropriate PPE?  X   Have you been living in the same home as a person with confirmed diagnosis of COVID-19 or a PUI (household contact)?    X   Have you been diagnosed with COVID-19?    X              What to do next: Answered NO to all: Answered YES to anything:   Proceed with unit schedule Follow the [facility] Inpatient Flowsheet.

## 2019-06-07 NOTE — Progress Notes (Signed)
The patient rated his day as a 6.5 out of a possible 10. He verbalized that he felt "scatter brained" and then groggy. His goal for tomorrow is to try to stay focused and to work on having his med's help him to feel better.

## 2019-06-07 NOTE — Progress Notes (Signed)
Pt appropriate and pleasant this shift   He complained of poor sleep and was eager to try his new medications   No further complaints   [place] NOVEL CORONAVIRUS (COVID-19) DAILY CHECK-OFF SYMPTOMS - answer yes or no to each - every day NO YES  Have you had a fever in the past 24 hours?  . Fever (Temp > 37.80C / 100F) X   Have you had any of these symptoms in the past 24 hours? . New Cough .  Sore Throat  .  Shortness of Breath .  Difficulty Breathing .  Unexplained Body Aches   X   Have you had any one of these symptoms in the past 24 hours not related to allergies?   . Runny Nose .  Nasal Congestion .  Sneezing   X   If you have had runny nose, nasal congestion, sneezing in the past 24 hours, has it worsened?  X   EXPOSURES - check yes or no X   Have you traveled outside the state in the past 14 days?  X   Have you been in contact with someone with a confirmed diagnosis of COVID-19 or PUI in the past 14 days without wearing appropriate PPE?  X   Have you been living in the same home as a person with confirmed diagnosis of COVID-19 or a PUI (household contact)?    X   Have you been diagnosed with COVID-19?    X              What to do next: Answered NO to all: Answered YES to anything:   Proceed with unit schedule Follow the [facility] Inpatient Flowsheet.

## 2019-06-08 MED ORDER — QUETIAPINE FUMARATE 25 MG PO TABS
25.0000 mg | ORAL_TABLET | Freq: Three times a day (TID) | ORAL | Status: DC
  Filled 2019-06-08 (×6): qty 21

## 2019-06-08 MED ORDER — SERTRALINE HCL 100 MG PO TABS
100.0000 mg | ORAL_TABLET | Freq: Every day | ORAL | Status: DC
  Filled 2019-06-08: qty 7

## 2019-06-08 MED ORDER — TOPIRAMATE 100 MG PO TABS: 100.0000 mg | ORAL_TABLET | Freq: Three times a day (TID) | ORAL | 1 refills | Status: AC

## 2019-06-08 MED ORDER — QUETIAPINE FUMARATE 25 MG PO TABS: 25.0000 mg | ORAL_TABLET | Freq: Three times a day (TID) | ORAL | 1 refills | Status: AC

## 2019-06-08 MED ORDER — SERTRALINE HCL 100 MG PO TABS: 100.0000 mg | ORAL_TABLET | Freq: Every day | ORAL | 1 refills | Status: AC

## 2019-06-08 MED ORDER — TOPIRAMATE 100 MG PO TABS
100.0000 mg | ORAL_TABLET | Freq: Three times a day (TID) | ORAL | Status: DC
  Filled 2019-06-08 (×4): qty 21

## 2019-06-08 MED ORDER — GABAPENTIN 300 MG PO CAPS: 600.0000 mg | ORAL_CAPSULE | Freq: Three times a day (TID) | ORAL | 2 refills | Status: AC

## 2019-06-08 NOTE — Progress Notes (Signed)
  [facility] Adult Case Management Discharge Plan :  Will you be returning to the same living situation after discharge:  Yes,  home At discharge, do you have transportation home?: Yes,  [employer] Do you have the ability to pay for your medications: No. Referred to [facility]  Release of information consent forms completed and in the chart.  Patient to Follow up at: Follow-up [facility] Follow up.   Specialty: Addiction Medicine Why: Referral made on 6/29. Currently there are no beds available at this time. If you wish to seek treatment after discharge please call and speak with [facility].  Contact information: [place] [address] [phone]        [facility] Follow up on 06/09/2019.   Why: Hospital discharge appointment is Thursday, 7/2 at 2:00p.  Please bring your photo ID, insurance card, SSN, current medications, and all discharge paperwork from this hospitalization. Contact information: [place] [address] [phone]           Next level of care provider has access to [place] and Suicide Prevention discussed: Yes,  with patient. Declined consents.  Have you used any form of tobacco in the last 30 days? (Cigarettes, Smokeless Tobacco, Cigars, and/or Pipes): Yes  Has patient been referred to the Quitline?: Patient refused referral  Patient has been referred for addiction treatment: Yes  Joellen Jersey, [place] 06/08/2019, 9:13 AM

## 2019-06-08 NOTE — Tx Team (Signed)
Interdisciplinary Treatment and Diagnostic Plan Update  06/08/2019 Time of Session: 9:00am Andrew QuamDaniel Debella Lucas MRN: [id number]  Principal Diagnosis: <principal problem not specified>  Secondary Diagnoses: Active Problems:   MDD (major depressive disorder)   Current Medications:  Current Facility-Administered Medications  Medication Dose Route Frequency Provider Last Rate Last Dose  . acetaminophen (TYLENOL) tablet 650 mg  650 mg Oral Q6H PRN Denzil Magnusonhomas, Lashunda, NP   650 mg at 06/03/19 2146  . alum & mag hydroxide-simeth (MAALOX/MYLANTA) 200-200-20 MG/5ML suspension 30 mL  30 mL Oral Q4H PRN Denzil Magnusonhomas, Lashunda, NP      . gabapentin (NEURONTIN) capsule 600 mg  600 mg Oral TID Malvin JohnsFarah, Brian, MD   600 mg at 06/08/19 0756  . methocarbamol (ROBAXIN) tablet 750 mg  750 mg Oral TID Malvin JohnsFarah, Brian, MD   750 mg at 06/08/19 0756  . nicotine (NICODERM CQ - dosed in mg/24 hours) patch 21 mg  21 mg Transdermal Daily Cobos, Rockey SituFernando A, MD   21 mg at 06/08/19 0757  . QUEtiapine (SEROQUEL) tablet 25 mg  25 mg Oral TID Malvin JohnsFarah, Brian, MD      . Melene Muller[START ON 06/09/2019] sertraline (ZOLOFT) tablet 100 mg  100 mg Oral Daily Cobos, Fernando A, MD      . sertraline (ZOLOFT) tablet 50 mg  50 mg Oral Daily Malvin JohnsFarah, Brian, MD   50 mg at 06/08/19 0758  . temazepam (RESTORIL) capsule 45 mg  45 mg Oral QHS Malvin JohnsFarah, Brian, MD   45 mg at 06/07/19 2131  . topiramate (TOPAMAX) tablet 100 mg  100 mg Oral TID Cobos, Rockey SituFernando A, MD      . topiramate (TOPAMAX) tablet 50 mg  50 mg Oral BID Malvin JohnsFarah, Brian, MD   50 mg at 06/08/19 [id number]   PTA Medications: Medications Prior to Admission  Medication Sig Dispense Refill Last Dose  . acetaminophen (TYLENOL) 500 MG tablet Take 1,500-2,000 mg by mouth every 6 (six) hours as needed for moderate pain.   Not Taking at Unknown time  . docusate sodium 100 MG CAPS Take 100 mg by mouth 2 (two) times daily as needed. (Patient not taking: Reported on 07/20/2017) 10 capsule  Not Taking at Unknown time  .  HYDROcodone-acetaminophen (NORCO/VICODIN) 5-325 MG per tablet Take 2 tablets by mouth every 4 (four) hours as needed. (Patient not taking: Reported on 07/20/2017) 20 tablet 0 Not Taking at Unknown time  . hydrocortisone (ANUSOL-HC) 2.5 % rectal cream Apply rectally 2 times daily (Patient not taking: Reported on 07/20/2017) 28.35 g 0 Not Taking at Unknown time  . naproxen sodium (ANAPROX) 220 MG tablet Take 440 mg by mouth 2 (two) times daily as needed (pain).   Not Taking at Unknown time  . nystatin (MYCOSTATIN) 100000 UNIT/ML suspension Take 5 mLs (500,000 Units total) by mouth 4 (four) times daily. (Patient not taking: Reported on 06/03/2019) 60 mL 0 Not Taking at Unknown time  . OxyCODONE (OXYCONTIN) 80 mg T12A Take 1 tablet (80 mg total) by mouth every 12 (twelve) hours. (Patient not taking: Reported on 07/20/2017) 10 tablet 0 Not Taking at Unknown time  . OxyCODONE 60 MG T12A Take 60 mg by mouth every 12 (twelve) hours. Do not fill before 02/23/13 (Patient not taking: Reported on 07/20/2017) 10 tablet 0 Not Taking at Unknown time  . polyethylene glycol powder (GLYCOLAX/MIRALAX) powder Take 17 g by mouth daily. (Patient not taking: Reported on 07/20/2017)   Not Taking at Unknown time  . rivaroxaban (XARELTO) 10 MG TABS tablet Take  1 tablet (10 mg total) by mouth daily. (Patient not taking: Reported on 07/20/2017) 14 tablet 0 Not Taking at Unknown time  . senna-docusate (SENOKOT-S) 8.6-50 MG per tablet Take 1 tablet by mouth at bedtime. (Patient not taking: Reported on 07/20/2017) 30 tablet 0 Not Taking at Unknown time  . traMADol (ULTRAM) 50 MG tablet Take 2 tablets (100 mg total) by mouth every 6 (six) hours. (Patient not taking: Reported on 07/20/2017) 100 tablet 1 Not Taking at Unknown time    Patient Stressors: Substance abuse  Patient Strengths: Average or above average intelligence Capable of independent living Physical Health  Treatment Modalities: Medication Management, Group therapy, Case  management,  1 to 1 session with clinician, Psychoeducation, Recreational therapy.   Physician Treatment Plan for Primary Diagnosis: <principal problem not specified> Long Term Goal(s): Improvement in symptoms so as ready for discharge Improvement in symptoms so as ready for discharge   Short Term Goals: Ability to identify triggers associated with substance abuse/mental health issues will improve Ability to identify changes in lifestyle to reduce recurrence of condition will improve Ability to verbalize feelings will improve Ability to disclose and discuss suicidal ideas Ability to demonstrate self-control will improve Ability to identify and develop effective coping behaviors will improve Ability to maintain clinical measurements within normal limits will improve Compliance with prescribed medications will improve  Medication Management: Evaluate patient's response, side effects, and tolerance of medication regimen.  Therapeutic Interventions: 1 to 1 sessions, Unit Group sessions and Medication administration.  Evaluation of Outcomes: Adequate for Discharge  Physician Treatment Plan for Secondary Diagnosis: Active Problems:   MDD (major depressive disorder)  Long Term Goal(s): Improvement in symptoms so as ready for discharge Improvement in symptoms so as ready for discharge   Short Term Goals: Ability to identify triggers associated with substance abuse/mental health issues will improve Ability to identify changes in lifestyle to reduce recurrence of condition will improve Ability to verbalize feelings will improve Ability to disclose and discuss suicidal ideas Ability to demonstrate self-control will improve Ability to identify and develop effective coping behaviors will improve Ability to maintain clinical measurements within normal limits will improve Compliance with prescribed medications will improve     Medication Management: Evaluate patient's response, side effects,  and tolerance of medication regimen.  Therapeutic Interventions: 1 to 1 sessions, Unit Group sessions and Medication administration.  Evaluation of Outcomes: Adequate for Discharge   RN Treatment Plan for Primary Diagnosis: <principal problem not specified> Long Term Goal(s): Knowledge of disease and therapeutic regimen to maintain health will improve  Short Term Goals: Ability to remain free from injury will improve, Ability to verbalize frustration and anger appropriately will improve and Ability to identify and develop effective coping behaviors will improve  Medication Management: RN will administer medications as ordered by provider, will assess and evaluate patient's response and provide education to patient for prescribed medication. RN will report any adverse and/or side effects to prescribing provider.  Therapeutic Interventions: 1 on 1 counseling sessions, Psychoeducation, Medication administration, Evaluate responses to treatment, Monitor vital signs and CBGs as ordered, Perform/monitor CIWA, COWS, AIMS and Fall Risk screenings as ordered, Perform wound care treatments as ordered.  Evaluation of Outcomes: Adequate for Discharge   LCSW Treatment Plan for Primary Diagnosis: <principal problem not specified> Long Term Goal(s): Safe transition to appropriate next level of care at discharge, Engage patient in therapeutic group addressing interpersonal concerns.  Short Term Goals: Engage patient in aftercare planning with referrals and resources, Increase social support, Identify  triggers associated with mental health/substance abuse issues and Increase skills for wellness and recovery  Therapeutic Interventions: Assess for all discharge needs, 1 to 1 time with Social worker, Explore available resources and support systems, Assess for adequacy in community support network, Educate family and significant other(s) on suicide prevention, Complete Psychosocial Assessment, Interpersonal group  therapy.  Evaluation of Outcomes: Adequate for Discharge   Progress in Treatment: Attending groups: Yes Participating in groups: Yes. Taking medication as prescribed: Yes. Toleration medication: Yes. Family/Significant other contact made: No, will contact:  declined consents, SPE was reviewed with patient. Patient understands diagnosis: Yes. Discussing patient identified problems/goals with staff: Yes. Medical problems stabilized or resolved: Yes. Denies suicidal/homicidal ideation: Yes. Issues/concerns per patient self-inventory: Yes.  New problem(s) identified: Yes, Describe:  homelessness  New Short Term/Long Term Goal(s): detox, medication management for mood stabilization; elimination of SI thoughts; development of comprehensive mental wellness/sobriety plan.  Patient Goals:    Discharge Plan or Barriers: [facility] referral sent, [facility] for outpatient follow up.  Reason for Continuation of Hospitalization: Anxiety Depression  Estimated Length of Stay: discharge today Attendees: Patient: 06/08/2019 8:35 AM  Physician:  06/08/2019 8:35 AM  Nursing:  06/08/2019 8:35 AM  RN Care Manager: 06/08/2019 8:35 AM  Social Worker: Enid Cutterharlotte Tyriek Hofman, LCSWA 06/08/2019 8:35 AM  Recreational Therapist:  06/08/2019 8:35 AM  Other:  06/08/2019 8:35 AM  Other:  06/08/2019 8:35 AM  Other: 06/08/2019 8:35 AM    Scribe for Treatment Team: Darreld Mcleanharlotte C Caiden Arteaga, LCSWA 06/08/2019 8:35 AM

## 2019-06-08 NOTE — [facility] Suicide Risk Assessment (Signed)
[facility] Discharge Suicide Risk Assessment   Principal Problem: Opiate dependency/chronic pain/related depressive symptoms Discharge Diagnoses: Active Problems:   MDD (major depressive disorder)   Total Time spent with patient: 45 minutes  Musculoskeletal: Strength & Muscle Tone: within normal limits Gait & Station: normal Patient leans: N/A  Psychiatric Specialty Exam: ROS  Blood pressure (!) 124/94, pulse 71, temperature 98.1 F (36.7 C), temperature source Oral, resp. rate 20, height 6' (1.829 m), weight 84.2 kg, SpO2 100 %.Body mass index is 25.17 kg/m.  General Appearance: Casual  Eye Contact::  Good  Speech:  Clear and Coherent409  Volume:  Normal  Mood:  Euthymic  Affect:  Full Range  Thought Process:  Coherent and Descriptions of Associations: Intact  Orientation:  Full (Time, Place, and Person)  Thought Content:  Logical  Suicidal Thoughts:  No  Homicidal Thoughts:  No  Memory:  Immediate;   Good  Judgement:  Good  Insight:  Good  Psychomotor Activity:  Normal  Concentration:  Good  Recall:  Good  Fund of Knowledge:Good  Language: Good  Akathisia:  Negative  Handed:  Right  AIMS (if indicated):     Assets:  Communication Skills Desire for Improvement Physical Health Resilience  Sleep:  Number of Hours: 6.25  Cognition: WNL  ADL's:  Intact   Mental Status Per Nursing Assessment::   On Admission:  Suicidal ideation indicated by patient  Demographic Factors:  Male  Loss Factors: NA  Historical Factors: NA  Risk Reduction Factors:   Sense of responsibility to family, Religious beliefs about death and Positive social support  Continued Clinical Symptoms:  Alcohol/Substance Abuse/Dependencies  Cognitive Features That Contribute To Risk:  None    Suicide Risk:  Minimal: No identifiable suicidal ideation.  Patients presenting with no risk factors but with morbid ruminations; may be classified as minimal risk based on the severity of the depressive  symptoms  Follow-up [facility] Follow up.   Specialty: Addiction Medicine Why: Referral made on 6/29. Currently there are no beds available at this time. If you wish to seek treatment after discharge please call and speak with [facility].  Contact information: [place] [address] [phone]        [facility] Follow up on 06/09/2019.   Why: Hospital discharge appointment is Thursday, 7/2 at 2:00p.  Please bring your photo ID, insurance card, SSN, current medications, and all discharge paperwork from this hospitalization. Contact information: [place] [address] [phone]           Plan Of Care/Follow-up recommendations:  Activity:  full  Jasiah Buntin, MD 06/08/2019, 8:16 AM

## 2019-06-08 NOTE — Progress Notes (Signed)
Recreation Therapy Notes  Date:  7.1.20 Time: 0930 Location: [address] Dayroom  Group Topic: Stress Management  Goal Area(s) Addresses:  Patient will identify positive stress management techniques. Patient will identify benefits of using stress management post d/c.  Behavioral Response: Engaged  Intervention:  Stress Management  Activity :  Guided Imagery.  LRT introduced the stress management technique of guided imagery.  LRT read a script that lead patients on a journey on the beach.  Patients were to listen and follow along as LRT read script.  Education:  Stress Management, Discharge Planning.   Education Outcome: Acknowledges Education  Clinical Observations/Feedback:  Pt attended and participated in group.    Victorino Sparrow, LRT/CTRS         Ria Comment, Tsugio Elison A 06/08/2019 11:13 AM

## 2019-06-08 NOTE — Progress Notes (Signed)
Patient reports that he had a good day and slept well last night with new medication.  He did report some grogginess that he thinks is related to seroquel but he did ask to take a third dose at bedtime.  He denies any thoughts of hurting himself or others and contracts for safety on the unit.  Medications administered as ordered.  Safety checks q 15 minutes.  Emotional support provided.  Safety maintained on unit.     [place] NOVEL CORONAVIRUS (COVID-19) DAILY CHECK-OFF SYMPTOMS - answer yes or no to each - every day NO YES  Have you had a fever in the past 24 hours?  . Fever (Temp > 37.80C / 100F) X   Have you had any of these symptoms in the past 24 hours? . New Cough .  Sore Throat  .  Shortness of Breath .  Difficulty Breathing .  Unexplained Body Aches   X   Have you had any one of these symptoms in the past 24 hours not related to allergies?   . Runny Nose .  Nasal Congestion .  Sneezing   X   If you have had runny nose, nasal congestion, sneezing in the past 24 hours, has it worsened?  X   EXPOSURES - check yes or no X   Have you traveled outside the state in the past 14 days?  X   Have you been in contact with someone with a confirmed diagnosis of COVID-19 or PUI in the past 14 days without wearing appropriate PPE?  X   Have you been living in the same home as a person with confirmed diagnosis of COVID-19 or a PUI (household contact)?    X   Have you been diagnosed with COVID-19?    X              What to do next: Answered NO to all: Answered YES to anything:   Proceed with unit schedule Follow the [facility] Inpatient Flowsheet.

## 2019-06-08 NOTE — Progress Notes (Signed)
Patient ID: [name], male   DOB: 10-Mar-1976, 43 y.o.   MRN: [id number] Pt d/c to home. D/c instructions and medications reviewed. Pt verbalizes understanding. Work Quarry manager and suicide prevention information from [facility] provided to pt. Pt verbalizes plan to attend op tx and attend meetings. Pt denies s.i.

## 2019-06-08 NOTE — Discharge Summary (Signed)
Physician Discharge Summary Note  Patient:  [name] is an 43 y.o., male MRN:  [id number] DOB:  Sep 20, 1976 Patient phone:  [phone] (home)  Patient address:   [address] [place] [address],  Total Time spent with patient: 45 minutes  Date of Admission:  06/02/2019 Date of Discharge: 06/08/2019  Reason for Admission:    History of Present Illness: 43 year old male, [facility] yesterday ( voluntarily). 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 Reports worsening depression, neuro-vegetative symptoms of depression, worsening  in the context of trying to taper self off opiates and experiencing withdrawal symptoms. On admission reported suicidal ideations with thoughts of shooting self . (At this time denies suicidal ideations/ reports " I was just feeling miserable with withdrawal, all I want is to feel better"). Endorses neuro-vegetative symptoms of depression as below, denies psychotic symptoms. At this time describes  symptoms of opiate withdrawal- nausea, myalgias, cramps, loose stools, feeling restless.  Principal Problem: Opiate dependency/depression/thoughts of self-harm Discharge Diagnoses: Active Problems:   MDD (major depressive disorder)   Past Psychiatric History: see eval  Past Medical History:  Past Medical History:  Diagnosis Date  . Medical history non-contributory     Past Surgical History:  Procedure Laterality Date  . COLON SURGERY    . EXTERNAL FIXATION LEG Right 02/09/2013   Procedure: EXTERNAL FIXATION LEG;  Surgeon: Eulas PostJoshua P Landau, MD;  Location: [facility] OR;  Service: Orthopedics;  Laterality: Right;  . LAPAROTOMY N/A 02/09/2013   Procedure: EXPLORATORY LAPAROTOMY with small bowel resection and repair of mesentery;  Surgeon: Cherylynn RidgesJames O Wyatt, MD;  [facility];  Service: General;  Laterality: N/A;  . OPEN REDUCTION INTERNAL FIXATION (ORIF) TIBIA/FIBULA FRACTURE Right  02/17/2013   Procedure: OPEN REDUCTION INTERNAL FIXATION (ORIF) RIGHT TIBIAL PILON AND ORIF RIGHT FIBULA FRACTURE AND REMOVAL OF SYNTHESE EXFIX;  Surgeon: Toni ArthursJohn Hewitt, MD;  Location: [facility] OR;  Service: Orthopedics;  Laterality: Right;   Family History: History reviewed. No pertinent family history. Family Psychiatric  History: see eval Social History:  Social History   Substance and Sexual Activity  Alcohol Use Yes     Social History   Substance and Sexual Activity  Drug Use No    Social History   Socioeconomic History  . Marital status: Legally Separated    Spouse name: Not on file  . Number of children: Not on file  . Years of education: Not on file  . Highest education level: Not on file  Occupational History  . Not on file  Social Needs  . Financial resource strain: Not on file  . Food insecurity    Worry: Not on file    Inability: Not on file  . Transportation needs    Medical: Not on file    Non-medical: Not on file  Tobacco Use  . Smoking status: Current Every Day Smoker    Types: Cigarettes  . Smokeless tobacco: Never Used  Substance and Sexual Activity  . Alcohol use: Yes  . Drug use: No  . Sexual activity: Not on file  Lifestyle  . Physical activity    Days per week: Not on file    Minutes per session: Not on file  . Stress: Not on file  Relationships  . Social Musicianconnections    Talks on phone: Not on file    Gets together: Not on file    Attends religious service: Not on  file    Active member of club or organization: Not on file    Attends meetings of clubs or organizations: Not on file    Relationship status: Not on file  Other Topics Concern  . Not on file  Social History Narrative  . Not on file    Hospital Course:   Patient detox generally successfully here using her standard protocol we did add Robaxin for muscle cramping.  Because of neuropathic pain we added gabapentin and topiramate doses escalated for the topiramate at the point of  discharge.  For depressive symptoms sertraline was added and escalated to 100 mg/day. Suicidal thoughts resolved, by the date of the first patient was alert oriented cooperative without thoughts of harming self or others contracting fully and eager for discharge she had no cravings, is stable for release.  Physical Findings: Aims score is 0 AIMS: Facial and Oral Movements Muscles of Facial Expression: None, normal Lips and Perioral Area: None, normal Jaw: None, normal Tongue: None, normal,Extremity Movements Upper (arms, wrists, hands, fingers): None, normal Lower (legs, knees, ankles, toes): None, normal, Trunk Movements Neck, shoulders, hips: None, normal, Overall Severity Severity of abnormal movements (highest score from questions above): None, normal Incapacitation due to abnormal movements: None, normal Patient's awareness of abnormal movements (rate only patient's report): No Awareness, Dental Status Current problems with teeth and/or dentures?: No Does patient usually wear dentures?: No  CIWA:    COWS:  COWS Total Score: 0 Musculoskeletal: Strength & Muscle Tone: within normal limits Gait & Station: normal Patient leans: N/A  Psychiatric Specialty Exam: ROS  Blood pressure (!) 124/94, pulse 71, temperature 98.1 F (36.7 C), temperature source Oral, resp. rate 20, height 6' (1.829 m), weight 84.2 kg, SpO2 100 %.Body mass index is 25.17 kg/m.  General Appearance: Casual  Eye Contact::  Good  Speech:  Clear and Coherent409  Volume:  Normal  Mood:  Euthymic  Affect:  Full Range  Thought Process:  Coherent and Descriptions of Associations: Intact  Orientation:  Full (Time, Place, and Person)  Thought Content:  Logical  Suicidal Thoughts:  No  Homicidal Thoughts:  No  Memory:  Immediate;   Good  Judgement:  Good  Insight:  Good  Psychomotor Activity:  Normal  Concentration:  Good  Recall:  Good  Fund of Knowledge:Good  Language: Good  Akathisia:  Negative  Handed:   Right  AIMS (if indicated):     Assets:  Communication Skills Desire for Improvement Physical Health Resilience  Sleep:  Number of Hours: 6.25  Cognition: WNL  ADL's:  Intact     Have you used any form of tobacco in the last 30 days? (Cigarettes, Smokeless Tobacco, Cigars, and/or Pipes): Yes  Has this patient used any form of tobacco in the last 30 days? (Cigarettes, Smokeless Tobacco, Cigars, and/or Pipes) Yes, No  Blood Alcohol level:  No results found for: [facility]  Metabolic Disorder Labs:  No results found for: HGBA1C, MPG No results found for: PROLACTIN No results found for: CHOL, TRIG, HDL, CHOLHDL, VLDL, LDLCALC  See Psychiatric Specialty Exam and Suicide Risk Assessment completed by Attending Physician prior to discharge.  Discharge destination:  Home  Is patient on multiple antipsychotic therapies at discharge:  No   Has Patient had three or more failed trials of antipsychotic monotherapy by history:  No  Recommended Plan for Multiple Antipsychotic Therapies: NA   Allergies as of 06/08/2019   Not on File     Medication List    STOP  taking these medications   acetaminophen 500 MG tablet Commonly known as: TYLENOL   DSS 100 MG Caps   HYDROcodone-acetaminophen 5-325 MG tablet Commonly known as: NORCO/VICODIN   nystatin 100000 UNIT/ML suspension Commonly known as: MYCOSTATIN   oxyCODONE 60 MG 12 hr tablet Commonly known as: OXYCONTIN   oxyCODONE 80 mg 12 hr tablet Commonly known as: OXYCONTIN   rivaroxaban 10 MG Tabs tablet Commonly known as: Xarelto   senna-docusate 8.6-50 MG tablet Commonly known as: Senokot-S   traMADol 50 MG tablet Commonly known as: ULTRAM     TAKE these medications     Indication  gabapentin 300 MG capsule Commonly known as: NEURONTIN Take 2 capsules (600 mg total) by mouth 3 (three) times daily.  Indication: Neuropathic Pain   hydrocortisone 2.5 % rectal cream Commonly known as: Anusol-HC Apply rectally 2 times  daily  Indication: Inflamed Hemorrhoids   naproxen sodium 220 MG tablet Commonly known as: ALEVE Take 440 mg by mouth 2 (two) times daily as needed (pain).  Indication: Joint Damage causing Pain and Loss of Function   polyethylene glycol powder 17 GM/SCOOP powder Commonly known as: GLYCOLAX/MIRALAX Take 17 g by mouth daily.  Indication: Constipation   QUEtiapine 25 MG tablet Commonly known as: SEROQUEL Take 1 tablet (25 mg total) by mouth 3 (three) times daily.  Indication: Depressive Phase of Manic-Depression   sertraline 100 MG tablet Commonly known as: ZOLOFT Take 1 tablet (100 mg total) by mouth daily. Start taking on: June 09, 2019  Indication: Major Depressive Disorder   topiramate 100 MG tablet Commonly known as: TOPAMAX Take 1 tablet (100 mg total) by mouth 3 (three) times daily.  Indication: Migraine Headache      Follow-up Information    [facility] Follow up.   Specialty: Addiction Medicine Why: Referral made on 6/29. Currently there are no beds available at this time. If you wish to seek treatment after discharge please call and speak with [facility].  Contact information: [address] [place] [address] [phone]        [facility] Follow up on 06/09/2019.   Why: Hospital discharge appointment is Thursday, 7/2 at 2:00p.  Please bring your photo ID, insurance card, SSN, current medications, and all discharge paperwork from this hospitalization. Contact information: [address] [place] [address] [phone]          SignedMalvin Johns: Janaysha Depaulo, MD 06/08/2019, 8:21 AM
